# Patient Record
Sex: Female | Born: 1965 | Race: White | Hispanic: No | Marital: Single | Smoking: Current every day smoker
Health system: Southern US, Community
[De-identification: ages and names within clinical notes are randomized; demographics above are authoritative.]

## PROBLEM LIST (undated history)

## (undated) DIAGNOSIS — E119 Type 2 diabetes mellitus without complications: Secondary | ICD-10-CM

## (undated) DIAGNOSIS — E079 Disorder of thyroid, unspecified: Secondary | ICD-10-CM

## (undated) HISTORY — PX: OTHER SURGICAL HISTORY: SHX169

## (undated) HISTORY — PX: ECTOPIC PREGNANCY SURGERY: SHX613

## (undated) HISTORY — PX: APPENDECTOMY: SHX54

---

## 2004-10-02 ENCOUNTER — Emergency Department: Payer: Self-pay | Admitting: Emergency Medicine

## 2005-01-04 ENCOUNTER — Ambulatory Visit: Payer: Self-pay | Admitting: Family Medicine

## 2007-02-24 ENCOUNTER — Other Ambulatory Visit: Payer: Self-pay

## 2007-02-24 ENCOUNTER — Emergency Department: Payer: Self-pay | Admitting: Emergency Medicine

## 2007-03-07 ENCOUNTER — Emergency Department: Payer: Self-pay | Admitting: Emergency Medicine

## 2007-06-03 ENCOUNTER — Ambulatory Visit: Payer: Self-pay | Admitting: *Deleted

## 2007-08-15 ENCOUNTER — Emergency Department: Payer: Self-pay | Admitting: Emergency Medicine

## 2008-06-10 ENCOUNTER — Emergency Department: Payer: Self-pay | Admitting: Emergency Medicine

## 2008-10-02 IMAGING — CR DG CHEST 2V
1 series · 2 of 2 positions shown · non-contrast
Comparison: none

REASON FOR EXAM: sob cough, chills fever, poss pneumonia
COMMENTS:

PROCEDURE:     DXR - DXR CHEST PA (OR AP) AND LATERAL  - August 15, 2007  [DATE]
RESULT:     The lung fields are clear. The heart, mediastinal and osseous
structures show no significant abnormalities. Compared to the prior exam of
02/24/2007, no significant interval changes are seen.

[Series 1: view not recorded · 0.17mm/px · 2 of 2 slices shown]
[im 1/2]
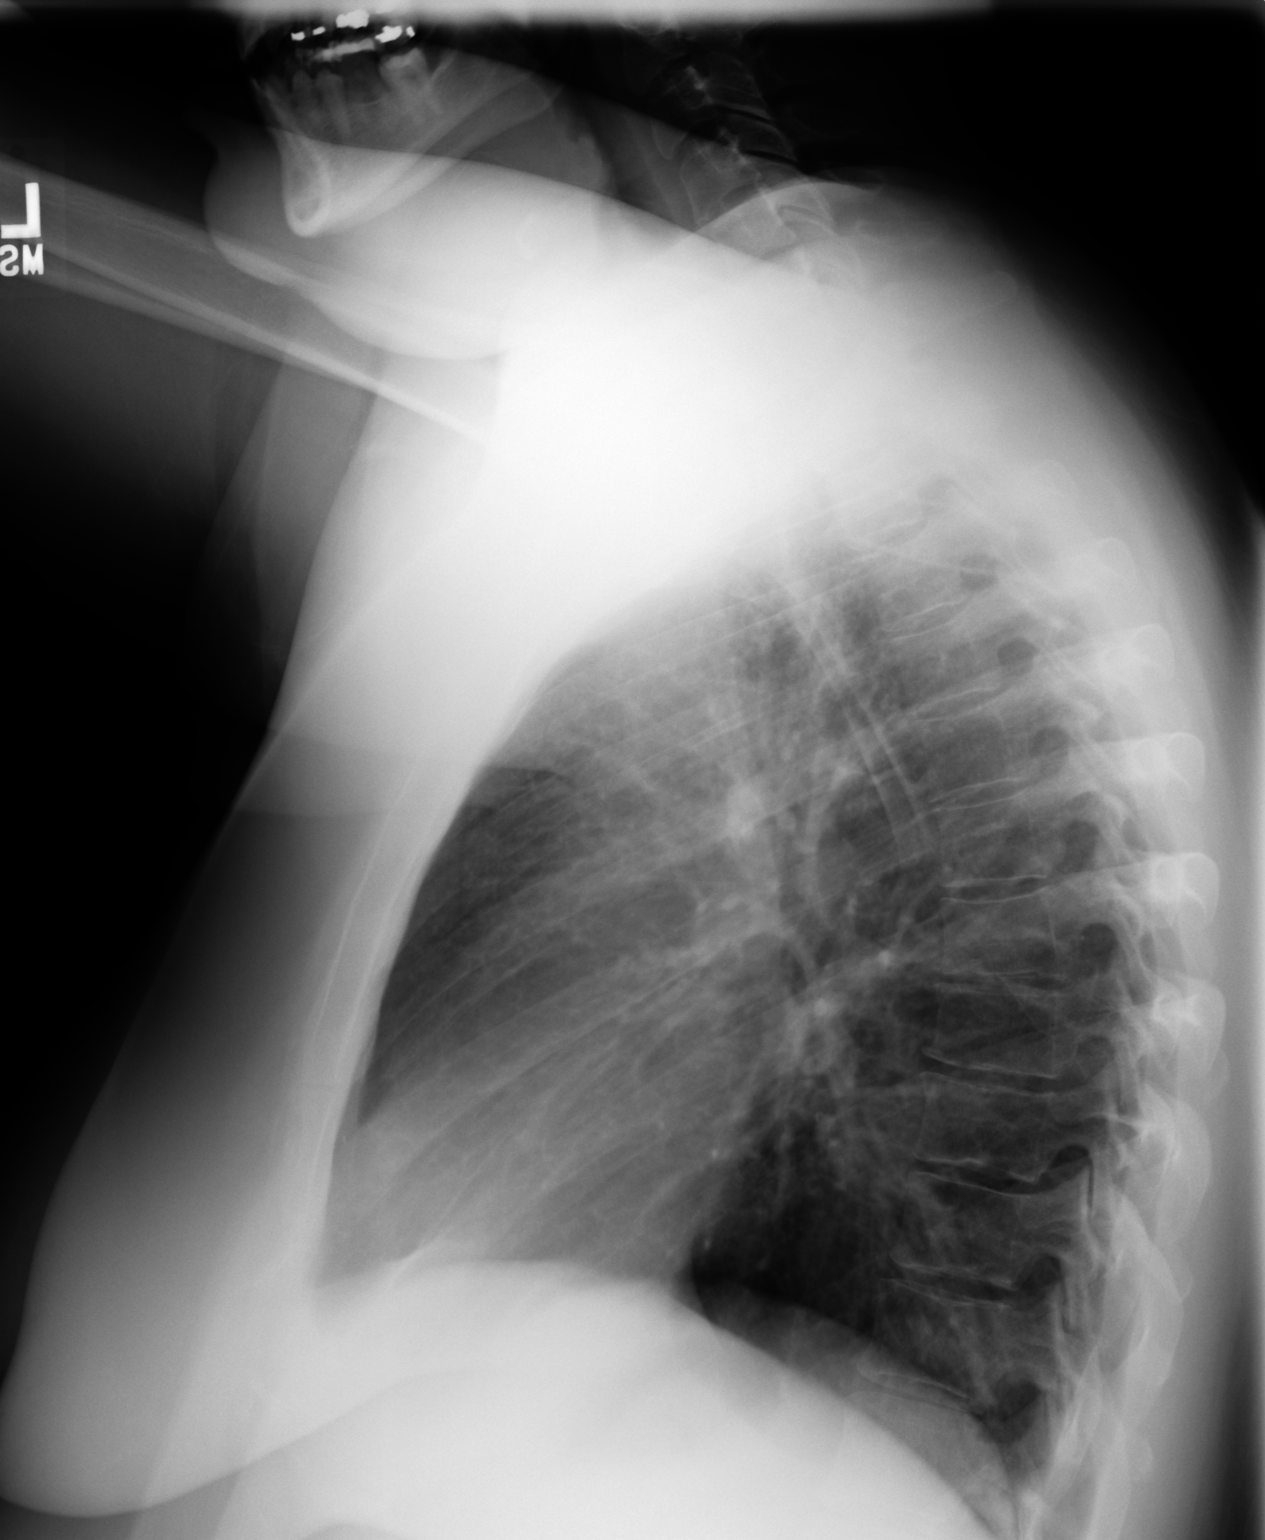
[im 2/2]
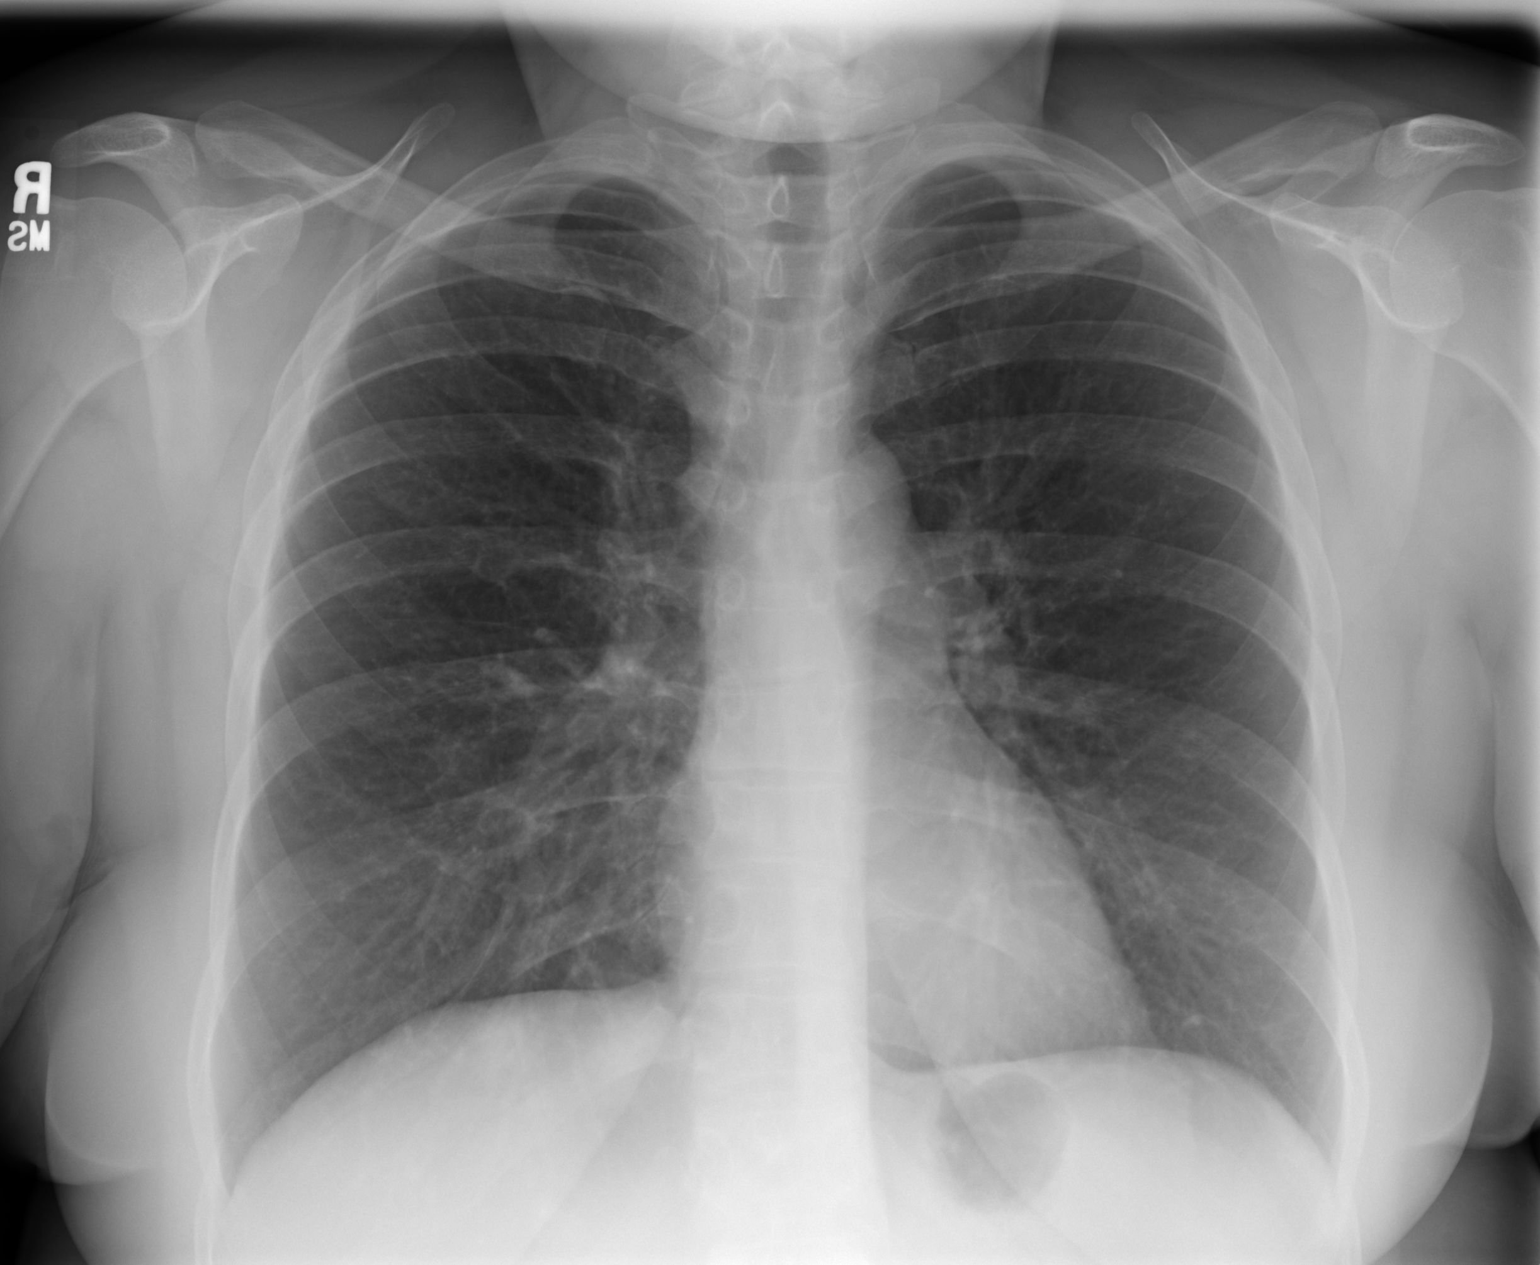

[2 of 2 positions shown; findings below may reference images not displayed]

IMPRESSION: 1.     No acute changes are identified.

## 2010-01-06 ENCOUNTER — Emergency Department: Payer: Self-pay | Admitting: Emergency Medicine

## 2016-05-18 DIAGNOSIS — F419 Anxiety disorder, unspecified: Secondary | ICD-10-CM | POA: Insufficient documentation

## 2016-05-18 DIAGNOSIS — E039 Hypothyroidism, unspecified: Secondary | ICD-10-CM | POA: Insufficient documentation

## 2016-07-16 ENCOUNTER — Ambulatory Visit (INDEPENDENT_AMBULATORY_CARE_PROVIDER_SITE_OTHER): Payer: Medicaid Other | Admitting: Podiatry

## 2016-07-16 ENCOUNTER — Ambulatory Visit: Payer: Self-pay

## 2016-07-16 DIAGNOSIS — M79675 Pain in left toe(s): Secondary | ICD-10-CM

## 2016-07-16 DIAGNOSIS — M2040 Other hammer toe(s) (acquired), unspecified foot: Secondary | ICD-10-CM | POA: Diagnosis not present

## 2016-07-16 NOTE — Progress Notes (Signed)
   Subjective:    Patient ID: Olivia MeekerSandra D Leonard, female    DOB: Sep 30, 1965, 50 y.o.   MRN: [id number]  HPI: She presents today to chief concern of second toenails bilaterally. 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 She has a history of previous ingrown toenails. And she states with these nails being where they are they're painful to wear shoes with.  Review of Systems  All other systems reviewed and are negative.      Objective:   Physical Exam: Vital signs are stable she is alert and oriented 3. Pulses are palpable. Neurologic sensorium is intact for some nausea monofilament. Deep tendon reflexes are intact. Muscle strength is 5 over 5 dorsal splint flexors and inverters everters on his musculature is intact. Orthopedic evaluation does demonstrate mild flexible hammertoe deformities early distal clavus second digit left foot possibly resulting in some of the numbness that she has. Her toenails do demonstrate a onychocryptosis particularly the hallux and second digits laterally. The remainder of the nail plates appear to be normal. There does not appear to be any fungus.        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  Plan: Debridement of these nails for her today. I also recommended that she follow-up with pedicurist.

## 2019-11-24 ENCOUNTER — Other Ambulatory Visit: Payer: Self-pay

## 2019-11-25 ENCOUNTER — Ambulatory Visit
Admission: RE | Admit: 2019-11-25 | Discharge: 2019-11-25 | Disposition: A | Discharge status: Home / Self Care | Payer: Medicaid Other | Source: Ambulatory Visit | Attending: Oncology | Admitting: Oncology

## 2019-11-25 ENCOUNTER — Ambulatory Visit: Payer: Medicaid Other | Attending: Oncology

## 2019-11-25 VITALS — BP 135/85 | HR 73 | Temp 95.9°F | Ht 66.5 in | Wt 285.4 lb

## 2019-11-25 DIAGNOSIS — Z0000 Encounter for general adult medical examination without abnormal findings: Secondary | ICD-10-CM

## 2019-11-25 NOTE — Progress Notes (Signed)
  Subjective:     Patient ID: Olivia Leonard, female   DOB: 1965/12/08, 54 y.o.   MRN: [id number]  HPI   Review of Systems     Objective:   Physical Exam Chest:     Breasts:        Right: No swelling, bleeding, inverted nipple, mass, nipple discharge, skin change or tenderness.        Left: No swelling, bleeding, inverted nipple, mass, nipple discharge, skin change or tenderness.        Assessment:     54 year old patient presents for BCCCP clinic visitPatient screened, and meets BCCCP eligibility.  Patient does not have insurance, Medicare or Medicaid. Instructed patient on breast self awareness using teach back methodClinical breast exam unremarkable. Risk Assessment    Risk Scores      11/25/2019   Last edited by: Alta Corning, CMA   5-year risk: 0.9 %   Lifetime risk: 7.8 %            Plan:     Sent for bilateral screening mammogram.

## 2019-12-01 ENCOUNTER — Other Ambulatory Visit: Payer: Self-pay | Admitting: *Deleted

## 2019-12-01 ENCOUNTER — Inpatient Hospital Stay
Admission: RE | Admit: 2019-12-01 | Discharge: 2019-12-01 | Disposition: A | Discharge status: Home / Self Care | Payer: Self-pay | Source: Ambulatory Visit | Attending: *Deleted | Admitting: *Deleted

## 2019-12-01 DIAGNOSIS — Z1231 Encounter for screening mammogram for malignant neoplasm of breast: Secondary | ICD-10-CM

## 2019-12-04 NOTE — Progress Notes (Signed)
Letter mailed from [facility] to notify of normal mammogram results.  Patient to return in one year for annual screening.  Copy to [facility]. 

## 2020-02-01 ENCOUNTER — Ambulatory Visit: Payer: Self-pay | Admitting: Podiatry

## 2020-04-05 ENCOUNTER — Other Ambulatory Visit: Payer: Self-pay

## 2020-04-05 ENCOUNTER — Emergency Department: Payer: Medicaid Other

## 2020-04-05 ENCOUNTER — Inpatient Hospital Stay
Admission: EM | Admit: 2020-04-05 | Discharge: 2020-04-09 | DRG: 177 | Disposition: A | Discharge status: Home / Self Care | Payer: Medicaid Other | Attending: Internal Medicine | Admitting: Internal Medicine

## 2020-04-05 ENCOUNTER — Inpatient Hospital Stay: Payer: Medicaid Other

## 2020-04-05 DIAGNOSIS — Z720 Tobacco use: Secondary | ICD-10-CM

## 2020-04-05 DIAGNOSIS — Z6841 Body Mass Index (BMI) 40.0 and over, adult: Secondary | ICD-10-CM | POA: Diagnosis not present

## 2020-04-05 DIAGNOSIS — A0839 Other viral enteritis: Secondary | ICD-10-CM | POA: Diagnosis present

## 2020-04-05 DIAGNOSIS — R042 Hemoptysis: Secondary | ICD-10-CM

## 2020-04-05 DIAGNOSIS — J45909 Unspecified asthma, uncomplicated: Secondary | ICD-10-CM | POA: Diagnosis present

## 2020-04-05 DIAGNOSIS — Z79890 Hormone replacement therapy (postmenopausal): Secondary | ICD-10-CM

## 2020-04-05 DIAGNOSIS — J309 Allergic rhinitis, unspecified: Secondary | ICD-10-CM | POA: Diagnosis not present

## 2020-04-05 DIAGNOSIS — R0902 Hypoxemia: Secondary | ICD-10-CM

## 2020-04-05 DIAGNOSIS — J4520 Mild intermittent asthma, uncomplicated: Secondary | ICD-10-CM | POA: Diagnosis not present

## 2020-04-05 DIAGNOSIS — J1282 Pneumonia due to coronavirus disease 2019: Secondary | ICD-10-CM | POA: Diagnosis present

## 2020-04-05 DIAGNOSIS — U071 COVID-19: Principal | ICD-10-CM | POA: Diagnosis present

## 2020-04-05 DIAGNOSIS — F17210 Nicotine dependence, cigarettes, uncomplicated: Secondary | ICD-10-CM | POA: Diagnosis present

## 2020-04-05 DIAGNOSIS — E039 Hypothyroidism, unspecified: Secondary | ICD-10-CM | POA: Diagnosis present

## 2020-04-05 DIAGNOSIS — K219 Gastro-esophageal reflux disease without esophagitis: Secondary | ICD-10-CM | POA: Diagnosis present

## 2020-04-05 DIAGNOSIS — Z881 Allergy status to other antibiotic agents status: Secondary | ICD-10-CM | POA: Diagnosis not present

## 2020-04-05 DIAGNOSIS — J9601 Acute respiratory failure with hypoxia: Secondary | ICD-10-CM | POA: Diagnosis not present

## 2020-04-05 HISTORY — DX: Disorder of thyroid, unspecified: E07.9

## 2020-04-05 LAB — HEPATIC FUNCTION PANEL
ALT: 22 U/L (ref 0–44)
AST: 26 U/L (ref 15–41)
Albumin: 3.5 g/dL (ref 3.5–5.0)
Alkaline Phosphatase: 85 U/L (ref 38–126)
Bilirubin, Direct: 0.1 mg/dL (ref 0.0–0.2)
Indirect Bilirubin: 0.8 mg/dL (ref 0.3–0.9)
Total Bilirubin: 0.9 mg/dL (ref 0.3–1.2)
Total Protein: 7.3 g/dL (ref 6.5–8.1)

## 2020-04-05 LAB — URINALYSIS, COMPLETE (UACMP) WITH MICROSCOPIC
Bacteria, UA: NONE SEEN
Bilirubin Urine: NEGATIVE
Glucose, UA: NEGATIVE mg/dL
Ketones, ur: NEGATIVE mg/dL
Leukocytes,Ua: NEGATIVE
Nitrite: NEGATIVE
Protein, ur: >=300 mg/dL — AB
Specific Gravity, Urine: 1.017 (ref 1.005–1.030)
pH: 6 (ref 5.0–8.0)

## 2020-04-05 LAB — BASIC METABOLIC PANEL
Anion gap: 11 (ref 5–15)
BUN: 9 mg/dL (ref 6–20)
CO2: 28 mmol/L (ref 22–32)
Calcium: 8.7 mg/dL — ABNORMAL LOW (ref 8.9–10.3)
Chloride: 98 mmol/L (ref 98–111)
Creatinine, Ser: 0.5 mg/dL (ref 0.44–1.00)
GFR calc Af Amer: >60 mL/min (ref >60)
GFR calc non Af Amer: >60 mL/min (ref >60)
Glucose, Bld: 113 mg/dL — ABNORMAL HIGH (ref 70–99)
Potassium: 4.2 mmol/L (ref 3.5–5.1)
Sodium: 137 mmol/L (ref 135–145)

## 2020-04-05 LAB — SARS CORONAVIRUS 2 BY RT PCR (HOSPITAL ORDER, PERFORMED IN ~~LOC~~ HOSPITAL LAB): SARS Coronavirus 2: POSITIVE — AB

## 2020-04-05 LAB — BRAIN NATRIURETIC PEPTIDE: B Natriuretic Peptide: 32.8 pg/mL (ref 0.0–100.0)

## 2020-04-05 LAB — CBC
HCT: 47.7 % — ABNORMAL HIGH (ref 36.0–46.0)
Hemoglobin: 14.1 g/dL (ref 12.0–15.0)
MCH: 24.1 pg — ABNORMAL LOW (ref 26.0–34.0)
MCHC: 29.6 g/dL — ABNORMAL LOW (ref 30.0–36.0)
MCV: 81.7 fL (ref 80.0–100.0)
Platelets: 160 10*3/uL (ref 150–400)
RBC: 5.84 MIL/uL — ABNORMAL HIGH (ref 3.87–5.11)
RDW: 17.4 % — ABNORMAL HIGH (ref 11.5–15.5)
WBC: 6 10*3/uL (ref 4.0–10.5)
nRBC: 0 % (ref 0.0–0.2)

## 2020-04-05 LAB — TROPONIN I (HIGH SENSITIVITY)
Troponin I (High Sensitivity): 7 ng/L (ref <18)
Troponin I (High Sensitivity): 5 ng/L (ref <18)

## 2020-04-05 LAB — MAGNESIUM: Magnesium: 2 mg/dL (ref 1.7–2.4)

## 2020-04-05 MED ORDER — PANTOPRAZOLE SODIUM 40 MG PO TBEC
40.0000 mg | DELAYED_RELEASE_TABLET | Freq: Every day | ORAL | Status: DC
  Administered 2020-04-06 – 2020-04-09 (×4): 40 mg via ORAL
  Filled 2020-04-05 (×4): qty 1

## 2020-04-05 MED ORDER — ENOXAPARIN SODIUM 40 MG/0.4ML ~~LOC~~ SOLN
40.0000 mg | Freq: Two times a day (BID) | SUBCUTANEOUS | Status: DC
  Administered 2020-04-06 (×2): 40 mg via SUBCUTANEOUS
  Filled 2020-04-05 (×5): qty 0.4

## 2020-04-05 MED ORDER — GUAIFENESIN-DM 100-10 MG/5ML PO SYRP: 10.0000 mL | ORAL_SOLUTION | ORAL | Status: DC | PRN

## 2020-04-05 MED ORDER — SODIUM CHLORIDE 0.9 % IV SOLN
200.0000 mg | Freq: Once | INTRAVENOUS | Status: AC
  Administered 2020-04-05: 200 mg via INTRAVENOUS
  Filled 2020-04-05: qty 200

## 2020-04-05 MED ORDER — SODIUM CHLORIDE 0.9 % IV SOLN
100.0000 mg | Freq: Every day | INTRAVENOUS | Status: AC
  Administered 2020-04-06 – 2020-04-09 (×4): 100 mg via INTRAVENOUS
  Filled 2020-04-05 (×3): qty 20

## 2020-04-05 MED ORDER — ALBUTEROL SULFATE HFA 108 (90 BASE) MCG/ACT IN AERS
2.0000 | INHALATION_SPRAY | Freq: Four times a day (QID) | RESPIRATORY_TRACT | Status: DC
  Administered 2020-04-06 – 2020-04-09 (×14): 2 via RESPIRATORY_TRACT
  Filled 2020-04-05: qty 6.7

## 2020-04-05 MED ORDER — IOHEXOL 350 MG/ML SOLN
100.0000 mL | Freq: Once | INTRAVENOUS | Status: AC | PRN
  Administered 2020-04-05: 100 mL via INTRAVENOUS
  Filled 2020-04-05: qty 100

## 2020-04-05 MED ORDER — LORATADINE 10 MG PO TABS
10.0000 mg | ORAL_TABLET | Freq: Every day | ORAL | Status: DC
  Administered 2020-04-06 – 2020-04-09 (×4): 10 mg via ORAL
  Filled 2020-04-05 (×4): qty 1

## 2020-04-05 MED ORDER — SODIUM CHLORIDE 0.9 % IV SOLN: 200.0000 mg | Freq: Once | INTRAVENOUS | Status: DC

## 2020-04-05 MED ORDER — HYDROCOD POLST-CPM POLST ER 10-8 MG/5ML PO SUER: 5.0000 mL | Freq: Two times a day (BID) | ORAL | Status: DC | PRN

## 2020-04-05 MED ORDER — DEXAMETHASONE SODIUM PHOSPHATE 10 MG/ML IJ SOLN
6.0000 mg | INTRAMUSCULAR | Status: DC
  Administered 2020-04-06: 6 mg via INTRAVENOUS
  Filled 2020-04-05 (×2): qty 1

## 2020-04-05 MED ORDER — ACETAMINOPHEN 500 MG PO TABS
1000.0000 mg | ORAL_TABLET | Freq: Once | ORAL | Status: AC
  Administered 2020-04-05: 1000 mg via ORAL
  Filled 2020-04-05: qty 2

## 2020-04-05 MED ORDER — LEVOTHYROXINE SODIUM 50 MCG PO TABS
175.0000 ug | ORAL_TABLET | Freq: Every day | ORAL | Status: DC
  Administered 2020-04-06 – 2020-04-09 (×4): 175 ug via ORAL
  Filled 2020-04-05 (×4): qty 1

## 2020-04-05 MED ORDER — SODIUM CHLORIDE 0.9 % IV SOLN: 100.0000 mg | Freq: Every day | INTRAVENOUS | Status: DC

## 2020-04-05 NOTE — ED Notes (Signed)
Pt states coming in due to coughing blood. Pt states being diagnosed with covid-19 5 days ago. Pt is on 2lpm via [place]. Pt states this is new.

## 2020-04-05 NOTE — Progress Notes (Signed)
Remdesivir - Pharmacy Brief Note   O:  ALT: 22  CXR:  SpO2:  95 % on RA   Was diagnosed with COVID on 8/5   A/P:  Remdesivir 200 mg IVPB once followed by 100 mg IVPB daily x 4 days.   Brace Welte D 04/05/2020 8:42 PM

## 2020-04-05 NOTE — ED Provider Notes (Signed)
[facility] Emergency Department Provider Note  ____________________________________________   First MD Initiated Contact with Patient 04/05/20 1900     (approximate)  I have reviewed the triage vital signs and the nursing notes.   HISTORY  Chief Complaint Hemoptysis   HPI Olivia Leonard is a 54 y.o. female with a past medical history of hypothyroidism, GERD, tobacco abuse, and asthma who presents for assessment of persistent shortness of breath associated with myalgias, fevers, diarrhea, and blood-streaked sputum over the last day.  Patient states he has been feeling bad for approximately 5 days and was tested for Covid and found to be positive on 8/5.  She states that all of her symptoms have progressed since then.  She states she had some red Jell-O on 8/7 and some red Gatorade on 8/6 but did not have any red foods yesterday the day before when she had some blood in her sputum.  She endorses mild headache denies any chest pain, dull pain, vomiting, rash, dysuria, blood in her stool, blood in her urine, being on blood thinners, or acute complaints.  No focal extremity weakness numbness or tingling.  No recent traumatic injuries.  No history of blood clots.  Patient is not currently coagulated.  Denies EtOH or illicit drug use.         Past Medical History:  Diagnosis Date  . Thyroid disease     Patient Active Problem List   Diagnosis Date Noted  . COVID-19 04/05/2020  . Hypoxia 04/05/2020  . Hemoptysis 04/05/2020  . Asthma 04/05/2020  . Allergic rhinitis 04/05/2020  . Tobacco use 04/05/2020  . Acquired hypothyroidism 05/18/2016  . Anxiety 05/18/2016    Past Surgical History:  Procedure Laterality Date  . APPENDECTOMY    . c-section    . ECTOPIC PREGNANCY SURGERY      Prior to Admission medications   Medication Sig Start Date End Date Taking? Authorizing Provider  cetirizine (ZYRTEC) 10 MG tablet Take 10 mg by mouth daily.    [provider]  levothyroxine (SYNTHROID, LEVOTHROID) 175 MCG tablet Take 175 mcg by mouth daily before breakfast.    [provider]  OMEPRAZOLE PO Take by mouth.    [provider]    Allergies Biaxin [clarithromycin]  No family history on file.  Social History Social History   Tobacco Use  . Smoking status: Current Every Day Smoker    Types: Cigarettes  . Smokeless tobacco: Never Used  Substance Use Topics  . Alcohol use: Not Currently  . Drug use: Not Currently    Review of Systems  Review of Systems  Constitutional: Positive for chills, fever and malaise/fatigue.  HENT: Negative for sore throat.   Eyes: Negative for pain.  Respiratory: Positive for cough and shortness of breath. Negative for stridor.   Cardiovascular: Negative for chest pain.  Gastrointestinal: Positive for diarrhea and nausea. Negative for vomiting.  Genitourinary: Negative for dysuria and frequency.  Musculoskeletal: Positive for myalgias.  Skin: Negative for rash.  Neurological: Negative for seizures, loss of consciousness and headaches.  Psychiatric/Behavioral: Negative for suicidal ideas.  All other systems reviewed and are negative.     ____________________________________________   PHYSICAL EXAM:  VITAL SIGNS: ED Triage Vitals  Enc Vitals Group     BP 04/05/20 1104 (!) 152/84     Pulse Rate 04/05/20 1104 (!) 107     Resp 04/05/20 1104 (!) 22     Temp 04/05/20 1104 99.3 F (37.4 C)  Temp Source 04/05/20 1104 Oral     SpO2 04/05/20 1104 (!) 88 %     Weight 04/05/20 1106 284 lb (128.8 kg)     Height 04/05/20 1106 5\' 6"  (1.676 m)     Head Circumference --      Peak Flow --      Pain Score 04/05/20 1106 3     Pain Loc --      Pain Edu? --      Excl. in GC? --    Vitals:   04/05/20 1448 04/05/20 2015  BP: (!) 152/72 140/82  Pulse: 87 86  Resp: (!) 24 (!) 24  Temp: 98.6 F (37 C)   SpO2: 94% 95%   Physical Exam Vitals and nursing note reviewed.   Constitutional:      General: She is not in acute distress.    Appearance: She is well-developed.  HENT:     Head: Normocephalic and atraumatic.     Right Ear: External ear normal.     Left Ear: External ear normal.     Nose: Nose normal.     Mouth/Throat:     Mouth: Mucous membranes are moist.  Eyes:     Conjunctiva/sclera: Conjunctivae normal.  Cardiovascular:     Rate and Rhythm: Normal rate and regular rhythm.     Heart sounds: No murmur heard.   Pulmonary:     Effort: Pulmonary effort is normal. Tachypnea present. No respiratory distress.     Breath sounds: Normal breath sounds.  Abdominal:     Palpations: Abdomen is soft.     Tenderness: There is no abdominal tenderness.  Musculoskeletal:     Cervical back: Neck supple.     Right lower leg: No edema.     Left lower leg: No edema.  Skin:    General: Skin is warm and dry.     Capillary Refill: Capillary refill takes less than 2 seconds.  Neurological:     Mental Status: She is alert and oriented to person, place, and time.  Psychiatric:        Mood and Affect: Mood normal.      ____________________________________________   LABS (all labs ordered are listed, but only abnormal results are displayed)  Labs Reviewed  BASIC METABOLIC PANEL - Abnormal; Notable for the following components:      Result Value   Glucose, Bld 113 (*)    Calcium 8.7 (*)    All other components within normal limits  CBC - Abnormal; Notable for the following components:   RBC 5.84 (*)    HCT 47.7 (*)    MCH 24.1 (*)    MCHC 29.6 (*)    RDW 17.4 (*)    All other components within normal limits  URINALYSIS, COMPLETE (UACMP) WITH MICROSCOPIC - Abnormal; Notable for the following components:   Color, Urine YELLOW (*)    APPearance HAZY (*)    Hgb urine dipstick SMALL (*)    Protein, ur >=300 (*)    All other components within normal limits  SARS CORONAVIRUS 2 BY RT PCR (HOSPITAL ORDER, PERFORMED IN [facility])  HEPATIC  FUNCTION PANEL  BRAIN NATRIURETIC PEPTIDE  MAGNESIUM  TROPONIN I (HIGH SENSITIVITY)   ____________________________________________  EKG  Sinus rhythm with a ventricular rate of 83, normal axis, unremarkable intervals, no evidence of acute ischemia or other underlying arrhythmia. ____________________________________________  RADIOLOGY  ED MD interpretation: Bilateral patchy infiltrates without evidence of significant edema, pneumothorax, or effusion.  Official  radiology report(s): DG Chest 2 View  Result Date: 04/05/2020 CLINICAL DATA:  Shortness of breath, hypoxia, COVID-19 positive EXAM: CHEST - 2 VIEW COMPARISON:  08/15/2007 FINDINGS: The heart size and mediastinal contours are within normal limits. Streaky bilateral interstitial opacities within the perihilar and bibasilar regions. No pleural effusion. No pneumothorax. The visualized skeletal structures are unremarkable. IMPRESSION: Streaky bilateral perihilar and bibasilar interstitial opacities suspicious for atypical/viral pneumonia. Electronically Signed   By: Duanne Guess D.O.   On: 04/05/2020 11:34    ____________________________________________   PROCEDURES  Procedure(s) performed (including Critical Care):  .1-3 Lead EKG Interpretation Performed by: Gilles Chiquito, MD Authorized by: Gilles Chiquito, MD     Interpretation: normal     ECG rate assessment: normal     Rhythm: sinus rhythm     Ectopy: none     Conduction: normal   .Critical Care Performed by: Gilles Chiquito, MD Authorized by: Gilles Chiquito, MD   Critical care provider statement:    Critical care time (minutes):  45   Critical care was necessary to treat or prevent imminent or life-threatening deterioration of the following conditions:  Respiratory failure   Critical care was time spent personally by me on the following activities:  Discussions with consultants, evaluation of patient's response to treatment, examination of patient,  ordering and performing treatments and interventions, ordering and review of laboratory studies, ordering and review of radiographic studies, pulse oximetry, re-evaluation of patient's condition, obtaining history from patient or surrogate and review of old charts     ____________________________________________   INITIAL IMPRESSION / ASSESSMENT AND PLAN / ED COURSE        Patient presents with above-stated history exam for assessment of hemoptysis in the setting of recent positive COVID-19 test and other above symptoms.  Patient is noted to be tachypneic and with SPO2 saturation of 95% on 2 L nasal cannula.  I did have the patient stand and ambulate in place with no oxygen supplementation and she was noted to have an SPO2 saturation of 87% before trial was stopped and patient was placed back on O2 cannula with improvement in her O2 to sats back to 95.  Chest x-ray does show findings concerning for likely Covid pneumonia given patient did have a positive test.  However since I am unable to view this test we will send a Covid PCR while she is in the ED.  In addition while it is certainly possible her blood-streaked sputum is from her COVID-19 causing some sloughing from her pulmonary epithelium will also plan to obtain a CTA chest to assess for evidence of PE.  Low suspicion for ACS given patient denies any chest pain and EKG is reassuring although we will plan to obtain troponins.  Patient given below noted therapies and admitted to hospital service and fair condition for further evaluation and management.  Medications  acetaminophen (TYLENOL) tablet 1,000 mg (1,000 mg Oral Given 04/05/20 1952)   _______________________________________   FINAL CLINICAL IMPRESSION(S) / ED DIAGNOSES  Final diagnoses:  Acute respiratory failure with hypoxia (HCC)  COVID-19     ED Discharge Orders    None       Note:  This document was prepared using Dragon voice recognition software and may include  unintentional dictation errors.   Gilles Chiquito, MD 04/05/20 2023

## 2020-04-05 NOTE — ED Triage Notes (Signed)
Pt states she was dx with covid last Thursday and since sunday she has had blood in her sputum when she coughs. Denies any increased SOB, is in NAD 88%RA in triage

## 2020-04-05 NOTE — H&P (Signed)
History and Physical        Hospital Admission Note Date: 04/05/2020  Patient name: Olivia Leonard Medical record number: [id number] Date of birth: [birth year] Age: 54 y.o. Gender: female  PCP: Abram Sander, MD    Patient coming from: Home   I have reviewed all records in the [facility].    Chief Complaint:  Hemoptysis   HPI: Olivia Leonard is 54 y.o. female with PMH of hypothyroidism, asthma, allergic rhinitis, GERD, tobacco use who presents for SOB and blood streaked sputum over the past day. Has been feeling poorly for five days with myalgias, fevers, diarrhea, and cough. Tested positive for COVID on 8/5 with rapid test. Boyfriend + as well. Not vaccinated.  She states she had some red Jell-O on 8/7 and some red Gatorade on 8/6 but did not have any red foods yesterday the day before when she had some blood in her sputum. Endorses some chest tightness but no frank pain. Denies blood in stool or urine. Not anticoagulated. No history of prior VTE.    ED work-up/course:  Patient presents with above-stated history exam for assessment of hemoptysis in the setting of recent positive COVID-19 test and other above symptoms.  Patient is noted to be tachypneic and with SPO2 saturation of 95% on 2 L nasal cannula.  I did have the patient stand and ambulate in place with no oxygen supplementation and she was noted to have an SPO2 saturation of 87% before trial was stopped and patient was placed back on O2 cannula with improvement in her O2 to sats back to 95.  Chest x-ray does show findings concerning for likely Covid pneumonia given patient did have a positive test.  However since I am unable to view this test we will send a Covid PCR while she is in the ED.  In addition while it is certainly possible her blood-streaked sputum is from her COVID-19 causing some sloughing from her pulmonary epithelium will also plan to obtain a CTA  chest to assess for evidence of PE.  Low suspicion for ACS given patient denies any chest pain and EKG is reassuring although we will plan to obtain troponins.  Patient given below noted therapies and admitted to hospital service and fair condition for further evaluation and management.    Review of Systems: Positives marked in 'bold' Constitutional: Denies fever, chills, diaphoresis, poor appetite and fatigue.  HEENT: Denies photophobia, eye pain, redness, hearing loss, ear pain, congestion, sore throat, rhinorrhea, sneezing, mouth sores, trouble swallowing, neck pain, neck stiffness and tinnitus.   Respiratory: Denies SOB, DOE, cough, chest tightness,  and wheezing.   Cardiovascular: Denies chest pain, palpitations and leg swelling.  Gastrointestinal: Denies nausea, vomiting, abdominal pain, diarrhea, constipation, blood in stool and abdominal distention.  Genitourinary: Denies dysuria, urgency, frequency, hematuria, flank pain and difficulty urinating.  Musculoskeletal: Denies myalgias, back pain, joint swelling, arthralgias and gait problem.  Skin: Denies pallor, rash and wound.  Neurological: Denies dizziness, seizures, syncope, weakness, light-headedness, numbness and headaches.  Hematological: Denies adenopathy. Easy bruising, personal or family bleeding history  Psychiatric/Behavioral: Denies suicidal ideation, mood changes, confusion, nervousness, sleep disturbance and agitation  Past Medical  History: Past Medical History:  Diagnosis Date  . Thyroid disease     Past Surgical History:  Procedure Laterality Date  . APPENDECTOMY    . c-section    . ECTOPIC PREGNANCY SURGERY      Medications: Prior to Admission medications   Medication Sig Start Date End Date Taking? Authorizing Provider  cetirizine (ZYRTEC) 10 MG tablet Take 10 mg by mouth daily.    [provider]  levothyroxine (SYNTHROID, LEVOTHROID) 175 MCG tablet Take 175 mcg by mouth daily before breakfast.     [provider]  OMEPRAZOLE PO Take by mouth.    [provider]    Allergies:   Allergies  Allergen Reactions  . Biaxin [Clarithromycin]     Social History:  reports that she has been smoking cigarettes. She has never used smokeless tobacco. She reports previous alcohol use. She reports previous drug use.  Family History: No family history on file.  Physical Exam: Blood pressure 140/82, pulse 86, temperature 98.6 F (37 C), temperature source Oral, resp. rate (!) 24, height 5\' 6"  (1.676 m), weight 128.8 kg, SpO2 95 %. General: Alert, awake, oriented x3, in no acute distress. Eyes: pink conjunctiva,anicteric sclera, pupils equal and reactive to light and accomodation, HEENT: normocephalic, atraumatic, oropharynx clear Neck: supple, no masses or lymphadenopathy, no goiter, no bruits, no JVD CVS: Regular rate and rhythm, without murmurs, rubs or gallops. No lower extremity edema Resp : Clear to auscultation bilaterally, no wheezing, rales or rhonchi. Tachypnea present. [place] in place.  GI : Soft, nontender, nondistended, positive bowel sounds, no masses. No hepatomegaly. No hernia.  Musculoskeletal: No clubbing or cyanosis, positive pedal pulses. No contracture. ROM intact  Neuro: Grossly intact, no focal neurological deficits, strength 5/5 upper and lower extremities bilaterally Psych: alert and oriented x 3, normal mood and affect Skin: no rashes or lesions, warm and dry   LABS on Admission: I have personally reviewed all the labs and imagings below    Basic Metabolic Panel: Recent Labs  Lab 04/05/20 1158  NA 137  K 4.2  CL 98  CO2 28  GLUCOSE 113*  BUN 9  CREATININE 0.50  CALCIUM 8.7*   Liver Function Tests: Recent Labs  Lab 04/05/20 1158  AST 26  ALT 22  ALKPHOS 85  BILITOT 0.9  PROT 7.3  ALBUMIN 3.5   No results for input(s): LIPASE, AMYLASE in the last 168 hours. No results for input(s): AMMONIA in the last 168 hours. CBC: Recent Labs   Lab 04/05/20 1158  WBC 6.0  HGB 14.1  HCT 47.7*  MCV 81.7  PLT 160   Cardiac Enzymes: No results for input(s): CKTOTAL, CKMB, CKMBINDEX, TROPONINI in the last 168 hours. BNP: Invalid input(s): POCBNP CBG: No results for input(s): GLUCAP in the last 168 hours.  Radiological Exams on Admission:  DG Chest 2 View  Result Date: 04/05/2020 CLINICAL DATA:  Shortness of breath, hypoxia, COVID-19 positive EXAM: CHEST - 2 VIEW COMPARISON:  08/15/2007 FINDINGS: The heart size and mediastinal contours are within normal limits. Streaky bilateral interstitial opacities within the perihilar and bibasilar regions. No pleural effusion. No pneumothorax. The visualized skeletal structures are unremarkable. IMPRESSION: Streaky bilateral perihilar and bibasilar interstitial opacities suspicious for atypical/viral pneumonia. Electronically Signed   By: 08/17/2007 D.O.   On: 04/05/2020 11:34      EKG: Independently reviewed. NSR.   Assessment/Plan Active Problems:   COVID-19   Hypoxia   Hemoptysis   Asthma   Allergic rhinitis  Tobacco use   Acute Hypoxia with Hemoptysis and Report of COVID+ test  Presumed COVID+ due to symptoms and report of outside positive test. Patient is hypoxic to upper 80s with ambulation. Stable on 2L . CXR shows streaky bilateral perihilar and bibasilar interstitial opacities suspicious for atypical/viral PNA.  -admit to MedSurg, telemetry with continuous pulse ox  -COVID testing pending  -obtain COVID labs today and daily  -CTA chest to evaluate for PE; will need full dose anticoagulation if positive  -start Decadron and Remdesivir  -antussives and albuterol ordered  -supplemental O2, wean as appropriate   Asthma/Allergic Rhinitis  -albuterol as above  -continue antihistamine   Tobacco Use  Has not smoked for 2 days.  -offer nicotine patch if needed during admission   DVT prophylaxis: Lovenox   CODE STATUS: FULL   Consults called: None    Family Communication: Admission, patients condition and plan of care including tests being ordered have been discussed with the patient who indicates understanding and agree with the plan and Code Status  Admission status: Inpatient   The medical decision making on this patient was of high complexity and the patient is at high risk for clinical deterioration, therefore this is a level 3 admission.  Severity of Illness:     The appropriate patient status for this patient is INPATIENT. Inpatient status is judged to be reasonable and necessary in order to provide the required intensity of service to ensure the patient's safety. The patient's presenting symptoms, physical exam findings, and initial radiographic and laboratory data in the context of their chronic comorbidities is felt to place them at high risk for further clinical deterioration. Furthermore, it is not anticipated that the patient will be medically stable for discharge from the hospital within 2 midnights of admission. The following factors support the patient status of inpatient.   " The patient's presenting symptoms include SOB, cough, hemoptysis. " The worrisome physical exam findings include tachypnea, hypoxia. " The initial radiographic and laboratory data are worrisome because of CXR with infiltrates. " The chronic co-morbidities include asthma, allergic rhinitis, tobacco use.   * I certify that at the point of admission it is my clinical judgment that the patient will require inpatient hospital care spanning beyond 2 midnights from the point of admission due to high intensity of service, high risk for further deterioration and high frequency of surveillance required.*    Time Spent on Admission: 45 minutes      De Hollingshead D.O.  [facility] 04/05/2020, 8:25 PM

## 2020-04-06 LAB — CBC WITH DIFFERENTIAL/PLATELET
Abs Immature Granulocytes: 0.02 10*3/uL (ref 0.00–0.07)
Basophils Absolute: 0 10*3/uL (ref 0.0–0.1)
Basophils Relative: 1 %
Eosinophils Absolute: 0 10*3/uL (ref 0.0–0.5)
Eosinophils Relative: 0 %
HCT: 48.4 % — ABNORMAL HIGH (ref 36.0–46.0)
Hemoglobin: 14.6 g/dL (ref 12.0–15.0)
Immature Granulocytes: 1 %
Lymphocytes Relative: 15 %
Lymphs Abs: 0.6 10*3/uL — ABNORMAL LOW (ref 0.7–4.0)
MCH: 24.6 pg — ABNORMAL LOW (ref 26.0–34.0)
MCHC: 30.2 g/dL (ref 30.0–36.0)
MCV: 81.5 fL (ref 80.0–100.0)
Monocytes Absolute: 0.1 10*3/uL (ref 0.1–1.0)
Monocytes Relative: 2 %
Neutro Abs: 3.5 10*3/uL (ref 1.7–7.7)
Neutrophils Relative %: 81 %
Platelets: 154 10*3/uL (ref 150–400)
RBC: 5.94 MIL/uL — ABNORMAL HIGH (ref 3.87–5.11)
RDW: 18 % — ABNORMAL HIGH (ref 11.5–15.5)
WBC: 4.3 10*3/uL (ref 4.0–10.5)
nRBC: 0 % (ref 0.0–0.2)

## 2020-04-06 LAB — FERRITIN: Ferritin: 30 ng/mL (ref 11–307)

## 2020-04-06 LAB — COMPREHENSIVE METABOLIC PANEL
ALT: 25 U/L (ref 0–44)
AST: 36 U/L (ref 15–41)
Albumin: 3.7 g/dL (ref 3.5–5.0)
Alkaline Phosphatase: 91 U/L (ref 38–126)
Anion gap: 8 (ref 5–15)
BUN: 12 mg/dL (ref 6–20)
CO2: 30 mmol/L (ref 22–32)
Calcium: 8.6 mg/dL — ABNORMAL LOW (ref 8.9–10.3)
Chloride: 99 mmol/L (ref 98–111)
Creatinine, Ser: 0.63 mg/dL (ref 0.44–1.00)
GFR calc Af Amer: >60 mL/min (ref >60)
GFR calc non Af Amer: >60 mL/min (ref >60)
Glucose, Bld: 227 mg/dL — ABNORMAL HIGH (ref 70–99)
Potassium: 4.4 mmol/L (ref 3.5–5.1)
Sodium: 137 mmol/L (ref 135–145)
Total Bilirubin: 0.8 mg/dL (ref 0.3–1.2)
Total Protein: 8 g/dL (ref 6.5–8.1)

## 2020-04-06 LAB — PROCALCITONIN: Procalcitonin: <0.1 ng/mL

## 2020-04-06 LAB — ABO/RH: ABO/RH(D): AB POS

## 2020-04-06 LAB — PHOSPHORUS: Phosphorus: 3.5 mg/dL (ref 2.5–4.6)

## 2020-04-06 LAB — FIBRIN DERIVATIVES D-DIMER (ARMC ONLY): Fibrin derivatives D-dimer (ARMC): 1050.66 ng/mL (FEU) — ABNORMAL HIGH (ref 0.00–499.00)

## 2020-04-06 LAB — MAGNESIUM: Magnesium: 2.1 mg/dL (ref 1.7–2.4)

## 2020-04-06 LAB — HIV ANTIBODY (ROUTINE TESTING W REFLEX): HIV Screen 4th Generation wRfx: NONREACTIVE

## 2020-04-06 LAB — FIBRINOGEN: Fibrinogen: 599 mg/dL — ABNORMAL HIGH (ref 210–475)

## 2020-04-06 LAB — C-REACTIVE PROTEIN: CRP: 8 mg/dL — ABNORMAL HIGH (ref <1.0)

## 2020-04-06 LAB — LACTATE DEHYDROGENASE: LDH: 193 U/L — ABNORMAL HIGH (ref 98–192)

## 2020-04-06 LAB — GLUCOSE, CAPILLARY
Glucose-Capillary: 157 mg/dL — ABNORMAL HIGH (ref 70–99)
Glucose-Capillary: 162 mg/dL — ABNORMAL HIGH (ref 70–99)

## 2020-04-06 MED ORDER — VITAMIN C 500 MG/5ML PO SYRP
500.0000 mg | ORAL_SOLUTION | Freq: Every day | ORAL | Status: DC
  Administered 2020-04-06 – 2020-04-09 (×4): 500 mg via ORAL
  Filled 2020-04-06 (×5): qty 5

## 2020-04-06 MED ORDER — SODIUM CHLORIDE 0.9 % IV SOLN
2.0000 g | INTRAVENOUS | Status: DC
  Administered 2020-04-07 – 2020-04-09 (×3): 2 g via INTRAVENOUS
  Filled 2020-04-06 (×4): qty 20

## 2020-04-06 MED ORDER — INSULIN ASPART 100 UNIT/ML ~~LOC~~ SOLN
0.0000 [IU] | Freq: Every day | SUBCUTANEOUS | Status: DC
  Administered 2020-04-07: 21:00:00 2 [IU] via SUBCUTANEOUS

## 2020-04-06 MED ORDER — LEVOFLOXACIN IN D5W 750 MG/150ML IV SOLN
750.0000 mg | INTRAVENOUS | Status: DC
  Administered 2020-04-06: 11:00:00 750 mg via INTRAVENOUS
  Filled 2020-04-06: qty 150

## 2020-04-06 MED ORDER — LORAZEPAM 1 MG PO TABS
1.0000 mg | ORAL_TABLET | Freq: Once | ORAL | Status: AC
  Administered 2020-04-06: 1 mg via ORAL
  Filled 2020-04-06: qty 1

## 2020-04-06 MED ORDER — SODIUM CHLORIDE 0.9 % IV SOLN
100.0000 mg | Freq: Two times a day (BID) | INTRAVENOUS | Status: DC
  Administered 2020-04-07 – 2020-04-09 (×5): 100 mg via INTRAVENOUS
  Filled 2020-04-06 (×7): qty 100

## 2020-04-06 MED ORDER — SODIUM CHLORIDE 0.9 % IV SOLN: 100.0000 mg | Freq: Two times a day (BID) | INTRAVENOUS | Status: DC

## 2020-04-06 MED ORDER — SODIUM CHLORIDE 0.9 % IV SOLN: 2.0000 g | INTRAVENOUS | Status: DC

## 2020-04-06 MED ORDER — SODIUM CHLORIDE 0.9 % IV SOLN
INTRAVENOUS | Status: DC | PRN
  Administered 2020-04-06: 11:00:00 500 mL via INTRAVENOUS

## 2020-04-06 MED ORDER — ZINC SULFATE 220 (50 ZN) MG PO CAPS
220.0000 mg | ORAL_CAPSULE | Freq: Every day | ORAL | Status: DC
  Administered 2020-04-06 – 2020-04-09 (×4): 220 mg via ORAL
  Filled 2020-04-06 (×4): qty 1

## 2020-04-06 MED ORDER — INSULIN ASPART 100 UNIT/ML ~~LOC~~ SOLN
0.0000 [IU] | Freq: Three times a day (TID) | SUBCUTANEOUS | Status: DC
  Administered 2020-04-06: 18:00:00 2 [IU] via SUBCUTANEOUS
  Administered 2020-04-07 – 2020-04-08 (×4): 1 [IU] via SUBCUTANEOUS
  Filled 2020-04-06 (×6): qty 1

## 2020-04-06 NOTE — Progress Notes (Signed)
Pt through out the night would have intermittent episodes of apnea, in which her O2 sat would drop to the 60-70's.   On the first occurrence around 2am, I asked patient if she had ever been diagnosed with sleep apnea. She said, "Yeah, but I can't wear those masks. I've tried all of them and I just can't wear it". I paged Webb Silversmith, NP. She told me to ask patient if she would be willing to wear a Bipap. The patient refused. I bumped up the pt's oxygen from 2L to 6L. Otherwise, due to her refusal and awareness of how badly her oxygen levels drop while she sleeps and becomes apenic, there isn't much more we can do.  Will continue to monitor pt.

## 2020-04-06 NOTE — Progress Notes (Addendum)
PROGRESS NOTE    Olivia Leonard  ZHG:[id number] DOB: May 02, 1966 DOA: 04/05/2020 PCP: Abram Sander, MD   Brief Narrative: 54 year old with past medical history significant for hypothyroidism, asthma, allergic rhinitis, GERD, tobacco use who presents complaining of shortness of breath and hemoptysis over the past day.  She has been feeling poorly over the last 5 days with myalgia, fever, diarrhea and cough.  She tested positive for Covid on 8/5.  She is not vaccinated.  Evaluation in the ED: Patient was tachypneic oxygen saturation 95 on 2 L, understanding oxygen saturation 87%.  Chest x-ray was negative.  CTA negative for PE, showed findings consistent with Extensive perihilar airspace infiltrate and consolidation, more prevalent within the right upper lobe and lingula most in keeping with changes of acute multifocal bronchopneumonia. The findings are not typical of that seen with COVID-19 pneumonia.    Assessment & Plan:   Active Problems:   COVID-19   Hypoxia   Hemoptysis   Asthma   Allergic rhinitis   Tobacco use  1-Acute Hypoxic Respiratory failure; secondary to COVID-19 pneumonia: COVID-19 positive Fibrinogen 599, D-dimer 1000, CT angio negative for PE, extensive perihilar airspace infiltrates and consolidation within the right upper lobe and lingula almost keeping with changes of acute multifocal bronchopneumonia. Continue with Remdesivir and Dexamethasone.  Add IV ceftriaxone and Doxy to cover for PNA>  Continue with Tussionex Advised patient to lay down in prone  position.  Start zinc and vitamin C Currently on HF oxygen 4--6 L.  2-asthma/allergic rhinitis Continue with albuterol  3-tobacco use: She has not smoked in 2 days.  Estimated body mass index is 45.84 kg/m as calculated from the following:   Height as of this encounter: 5\' 6"  (1.676 m).   Weight as of this encounter: 128.8 kg.   DVT prophylaxis: Lovenox Code Status: Full code Family Communication:  Care discussed with patient Disposition Plan:  Status is: Inpatient  Remains inpatient appropriate because:Hemodynamically unstable   Dispo: The patient is from: Home              Anticipated d/c is to: Home              Anticipated d/c date is: 2 days              Patient currently is not medically stable to d/c.        Consultants:   None  Procedures:   None  Antimicrobials:  Ceftriaxone and doxycycline  Subjective: Patient reports shortness of breath and cough.  She also report hemoptysis, mild. He denies chest pain. I inform her CTA results, negative for PE, findings consistent with pneumonia. Discussed option for Actemra, in case her oxygen requirement increased.  Patient would like to avoid this medication if possible, she does not want any experimental drugs.  Objective: Vitals:   04/06/20 0730 04/06/20 0732 04/06/20 0820 04/06/20 1140  BP:   (!) 143/90 129/63  Pulse:   83 69  Resp:   17   Temp:   98.1 F (36.7 C) 98 F (36.7 C)  TempSrc:   Oral Oral  SpO2: (!) 73% 94% 93% 92%  Weight:      Height:       No intake or output data in the 24 hours ending 04/06/20 1411 Filed Weights   04/05/20 1106  Weight: 128.8 kg    Examination:  General exam: Appears calm and comfortable  Respiratory system: Bilateral ronchus Respiratory effort normal. Cardiovascular system: S1 & S2 heard, RRR.  No JVD, murmurs, rubs, gallops or clicks. No pedal edema. Gastrointestinal system: Abdomen is nondistended, soft and nontender. No organomegaly or masses felt. Normal bowel sounds heard. Central nervous system: Alert and oriented. No focal neurological deficits. Extremities: Symmetric 5 x 5 power. Skin: No rashes, lesions or ulcers    Data Reviewed: I have personally reviewed following labs and imaging studies  CBC: Recent Labs  Lab 04/05/20 1158 04/06/20 0557  WBC 6.0 4.3  NEUTROABS  --  3.5  HGB 14.1 14.6  HCT 47.7* 48.4*  MCV 81.7 81.5  PLT 160 154    Basic Metabolic Panel: Recent Labs  Lab 04/05/20 1158 04/05/20 1911 04/06/20 0557  NA 137  --  137  K 4.2  --  4.4  CL 98  --  99  CO2 28  --  30  GLUCOSE 113*  --  227*  BUN 9  --  12  CREATININE 0.50  --  0.63  CALCIUM 8.7*  --  8.6*  MG  --  2.0 2.1  PHOS  --   --  3.5   GFR: Estimated Creatinine Clearance: 113.1 mL/min (by C-G formula based on SCr of 0.63 mg/dL). Liver Function Tests: Recent Labs  Lab 04/05/20 1158 04/06/20 0557  AST 26 36  ALT 22 25  ALKPHOS 85 91  BILITOT 0.9 0.8  PROT 7.3 8.0  ALBUMIN 3.5 3.7   No results for input(s): LIPASE, AMYLASE in the last 168 hours. No results for input(s): AMMONIA in the last 168 hours. Coagulation Profile: No results for input(s): INR, PROTIME in the last 168 hours. Cardiac Enzymes: No results for input(s): CKTOTAL, CKMB, CKMBINDEX, TROPONINI in the last 168 hours. BNP (last 3 results) No results for input(s): PROBNP in the last 8760 hours. HbA1C: No results for input(s): HGBA1C in the last 72 hours. CBG: No results for input(s): GLUCAP in the last 168 hours. Lipid Profile: No results for input(s): CHOL, HDL, LDLCALC, TRIG, CHOLHDL, LDLDIRECT in the last 72 hours. Thyroid Function Tests: No results for input(s): TSH, T4TOTAL, FREET4, T3FREE, THYROIDAB in the last 72 hours. Anemia Panel: Recent Labs    04/06/20 0557  FERRITIN 30   Sepsis Labs: Recent Labs  Lab 04/06/20 0557  PROCALCITON <0.10    Recent Results (from the past 240 hour(s))  SARS Coronavirus 2 by RT PCR (hospital order, performed in [facility] hospital lab) Nasopharyngeal Nasopharyngeal Swab     Status: Abnormal   Collection Time: 04/05/20  7:51 PM   Specimen: Nasopharyngeal Swab  Result Value Ref Range Status   SARS Coronavirus 2 POSITIVE (A) NEGATIVE Final    Comment: RESULT CALLED TO, READ BACK BY AND VERIFIED WITH: Nita Sells 04/05/20 AT 2214 BY AR (NOTE) SARS-CoV-2 target nucleic acids are DETECTED  SARS-CoV-2 RNA is  generally detectable in upper respiratory specimens  during the acute phase of infection.  Positive results are indicative  of the presence of the identified virus, but do not rule out bacterial infection or co-infection with other pathogens not detected by the test.  Clinical correlation with patient history and  other diagnostic information is necessary to determine patient infection status.  The expected result is negative.  Fact Sheet for Patients:   BoilerBrush.com.cy   Fact Sheet for Healthcare Providers:   https://pope.com/    This test is not yet approved or cleared by the [place] FDA and  has been authorized for detection and/or diagnosis of SARS-CoV-2 by FDA under an Emergency Use Authorization (EUA).  This EUA will remain in effect (meaning this  test can be used) for the duration of  the COVID-19 declaration under Section 564(b)(1) of the Act, 21 U.S.C. section 360-bbb-3(b)(1), unless the authorization is terminated or revoked sooner.  Performed at [facility], [address] [place], [place] [address]          Radiology Studies: DG Chest 2 View  Result Date: 04/05/2020 CLINICAL DATA:  Shortness of breath, hypoxia, COVID-19 positive EXAM: CHEST - 2 VIEW COMPARISON:  08/15/2007 FINDINGS: The heart size and mediastinal contours are within normal limits. Streaky bilateral interstitial opacities within the perihilar and bibasilar regions. No pleural effusion. No pneumothorax. The visualized skeletal structures are unremarkable. IMPRESSION: Streaky bilateral perihilar and bibasilar interstitial opacities suspicious for atypical/viral pneumonia. Electronically Signed   By: Duanne Guess D.O.   On: 04/05/2020 11:34   CT Angio Chest PE W and/or Wo Contrast  Result Date: 04/05/2020 CLINICAL DATA:  Dyspnea, hemoptysis EXAM: CT ANGIOGRAPHY CHEST WITH CONTRAST TECHNIQUE: Multidetector CT imaging of the chest was  performed using the standard protocol during bolus administration of intravenous contrast. Multiplanar CT image reconstructions and MIPs were obtained to evaluate the vascular anatomy. CONTRAST:  OMNIPAQUE IOHEXOL 350 MG/ML SOLN COMPARISON:  06/03/2007 FINDINGS: Cardiovascular: Satisfactory opacification of the pulmonary arteries to the segmental level. No evidence of pulmonary embolism. No significant coronary artery calcification. Normal heart size. No pericardial effusion. Thoracic aorta is unremarkable. Mediastinum/Nodes: There is extensive mediastinal and bilateral hilar adenopathy, possibly reactive in nature given the associated findings. The esophagus is unremarkable. Thyroid unremarkable. Lungs/Pleura: There is extensive perihilar airspace infiltrate and consolidation, more prevalent within the right upper lobe and lingula most in keeping with changes of acute multifocal bronchopneumonia. No pneumothorax or pleural effusion. The central airways are widely patent. Upper Abdomen: At least mild hepatic steatosis is noted. Musculoskeletal: No acute bone abnormalities. Review of the MIP images confirms the above findings. IMPRESSION: 1. No evidence of acute pulmonary embolism. 2. Extensive perihilar airspace infiltrate and consolidation, more prevalent within the right upper lobe and lingula most in keeping with changes of acute multifocal bronchopneumonia. The findings are not typical of that seen with COVID-19 pneumonia. 3. Extensive mediastinal and bilateral hilar adenopathy, possibly reactive in nature. 4. At least mild hepatic steatosis. Electronically Signed   By: Helyn Numbers MD   On: 04/05/2020 21:13        Scheduled Meds: . albuterol  2 puff Inhalation Q6H  . dexamethasone (DECADRON) injection  6 mg Intravenous Q24H  . enoxaparin (LOVENOX) injection  40 mg Subcutaneous BID  . insulin aspart  0-5 Units Subcutaneous QHS  . insulin aspart  0-9 Units Subcutaneous TID WC  . levothyroxine   175 mcg Oral QAC breakfast  . loratadine  10 mg Oral Daily  . pantoprazole  40 mg Oral Daily   Continuous Infusions: . sodium chloride 500 mL (04/06/20 1119)  . [START ON 04/07/2020] cefTRIAXone (ROCEPHIN)  IV    . [START ON 04/07/2020] doxycycline (VIBRAMYCIN) IV    . remdesivir 100 mg in NS 100 mL 100 mg (04/06/20 0958)     LOS: 1 day    Time spent: 35 minutes,      Sloan Takagi A Camile Esters, MD [facility]   If 7PM-7AM, please contact night-coverage [email or web address]  04/06/2020, 2:11 PM

## 2020-04-07 LAB — COMPREHENSIVE METABOLIC PANEL
ALT: 22 U/L (ref 0–44)
AST: 26 U/L (ref 15–41)
Albumin: 3.5 g/dL (ref 3.5–5.0)
Alkaline Phosphatase: 82 U/L (ref 38–126)
Anion gap: 9 (ref 5–15)
BUN: 15 mg/dL (ref 6–20)
CO2: 28 mmol/L (ref 22–32)
Calcium: 9.2 mg/dL (ref 8.9–10.3)
Chloride: 104 mmol/L (ref 98–111)
Creatinine, Ser: 0.6 mg/dL (ref 0.44–1.00)
GFR calc Af Amer: >60 mL/min (ref >60)
GFR calc non Af Amer: >60 mL/min (ref >60)
Glucose, Bld: 125 mg/dL — ABNORMAL HIGH (ref 70–99)
Potassium: 4.4 mmol/L (ref 3.5–5.1)
Sodium: 141 mmol/L (ref 135–145)
Total Bilirubin: 0.6 mg/dL (ref 0.3–1.2)
Total Protein: 7.7 g/dL (ref 6.5–8.1)

## 2020-04-07 LAB — CBC WITH DIFFERENTIAL/PLATELET
Abs Immature Granulocytes: 0.04 10*3/uL (ref 0.00–0.07)
Basophils Absolute: 0 10*3/uL (ref 0.0–0.1)
Basophils Relative: 0 %
Eosinophils Absolute: 0 10*3/uL (ref 0.0–0.5)
Eosinophils Relative: 0 %
HCT: 45.9 % (ref 36.0–46.0)
Hemoglobin: 14 g/dL (ref 12.0–15.0)
Immature Granulocytes: 1 %
Lymphocytes Relative: 21 %
Lymphs Abs: 1.4 10*3/uL (ref 0.7–4.0)
MCH: 24.5 pg — ABNORMAL LOW (ref 26.0–34.0)
MCHC: 30.5 g/dL (ref 30.0–36.0)
MCV: 80.2 fL (ref 80.0–100.0)
Monocytes Absolute: 0.7 10*3/uL (ref 0.1–1.0)
Monocytes Relative: 11 %
Neutro Abs: 4.4 10*3/uL (ref 1.7–7.7)
Neutrophils Relative %: 67 %
Platelets: 182 10*3/uL (ref 150–400)
RBC: 5.72 MIL/uL — ABNORMAL HIGH (ref 3.87–5.11)
RDW: 17.4 % — ABNORMAL HIGH (ref 11.5–15.5)
WBC: 6.5 10*3/uL (ref 4.0–10.5)
nRBC: 0 % (ref 0.0–0.2)

## 2020-04-07 LAB — HEMOGLOBIN A1C
Hgb A1c MFr Bld: 6.8 % — ABNORMAL HIGH (ref 4.8–5.6)
Mean Plasma Glucose: 148 mg/dL

## 2020-04-07 LAB — GLUCOSE, CAPILLARY
Glucose-Capillary: 129 mg/dL — ABNORMAL HIGH (ref 70–99)
Glucose-Capillary: 171 mg/dL — ABNORMAL HIGH (ref 70–99)
Glucose-Capillary: 139 mg/dL — ABNORMAL HIGH (ref 70–99)
Glucose-Capillary: 225 mg/dL — ABNORMAL HIGH (ref 70–99)

## 2020-04-07 LAB — MAGNESIUM: Magnesium: 1.9 mg/dL (ref 1.7–2.4)

## 2020-04-07 LAB — PHOSPHORUS: Phosphorus: 3.9 mg/dL (ref 2.5–4.6)

## 2020-04-07 LAB — FIBRIN DERIVATIVES D-DIMER (ARMC ONLY): Fibrin derivatives D-dimer (ARMC): 888.94 ng/mL (FEU) — ABNORMAL HIGH (ref 0.00–499.00)

## 2020-04-07 LAB — C-REACTIVE PROTEIN: CRP: 3.8 mg/dL — ABNORMAL HIGH (ref <1.0)

## 2020-04-07 LAB — FERRITIN: Ferritin: 28 ng/mL (ref 11–307)

## 2020-04-07 MED ORDER — DEXAMETHASONE SODIUM PHOSPHATE 10 MG/ML IJ SOLN
6.0000 mg | INTRAMUSCULAR | Status: DC
  Administered 2020-04-07 – 2020-04-09 (×3): 6 mg via INTRAVENOUS
  Filled 2020-04-07 (×3): qty 1

## 2020-04-07 MED ORDER — POLYETHYLENE GLYCOL 3350 17 G PO PACK: 17.0000 g | PACK | Freq: Every day | ORAL | Status: DC | PRN

## 2020-04-07 NOTE — Progress Notes (Signed)
Patient refused pm scheduled lovenox. Patient education provided.

## 2020-04-07 NOTE — Progress Notes (Signed)
PROGRESS NOTE    Olivia Leonard  HKV:[id number] DOB: June 03, 1966 DOA: 04/05/2020 PCP: Abram Sander, MD   Brief Narrative: 54 year old with past medical history significant for hypothyroidism, asthma, allergic rhinitis, GERD, tobacco use who presents complaining of shortness of breath and hemoptysis over the past day.  She has been feeling poorly over the last 5 days with myalgia, fever, diarrhea and cough.  She tested positive for Covid on 8/5.  She is not vaccinated.  Evaluation in the ED: Patient was tachypneic oxygen saturation 95 on 2 L, understanding oxygen saturation 87%.  Chest x-ray was negative.  CTA negative for PE, showed findings consistent with Extensive perihilar airspace infiltrate and consolidation, more prevalent within the right upper lobe and lingula most in keeping with changes of acute multifocal bronchopneumonia. The findings are not typical of that seen with COVID-19 pneumonia.    Assessment & Plan:   Active Problems:   COVID-19   Hypoxia   Hemoptysis   Asthma   Allergic rhinitis   Tobacco use  1-Acute Hypoxic Respiratory failure; secondary to COVID-19 pneumonia: COVID-19 positive Fibrinogen 599, D-dimer 1000, CT angio negative for PE, extensive perihilar airspace infiltrates and consolidation within the right upper lobe and lingula almost keeping with changes of acute multifocal bronchopneumonia. Continue with Remdesivir day 3. and Dexamethasone.  Continue with IV ceftriaxone and Doxy to cover for PNA>  Continue with Tussionex Advised patient to lay down in prone  position.  Continue  zinc and vitamin C Currently on 4 L  Oxygen.  Inflammatory Markers trending down.   2-Asthma/allergic rhinitis Continue with albuterol  3-tobacco use: She has not smoked in 2 days.  4-Morbid Obesity; needs life style modifications.   Estimated body mass index is 45.84 kg/m as calculated from the following:   Height as of this encounter: 5\' 6"  (1.676 m).   Weight  as of this encounter: 128.8 kg.   DVT prophylaxis: Lovenox, refuse DVT prophylaxis.  Code Status: Full code Family Communication: Care discussed with patient Disposition Plan:  Status is: Inpatient  Remains inpatient appropriate because:Hemodynamically unstable   Dispo: The patient is from: Home              Anticipated d/c is to: Home              Anticipated d/c date is: 2 days              Patient currently is not medically stable to d/c.        Consultants:   None  Procedures:   None  Antimicrobials:  Ceftriaxone and doxycycline  Subjective: Patient remain stable on 4 L oxygen. No worsening dyspnea.  Discussed importance of DVT prophylaxis and importance of dexamethasone.   Objective: Vitals:   04/06/20 2100 04/07/20 0427 04/07/20 0809 04/07/20 1208  BP: (!) 148/76 (!) 157/91 (!) 149/84 130/67  Pulse: 86 82 (!) 58 70  Resp: 18 15 16 18   Temp: 98.1 F (36.7 C) 98.1 F (36.7 C) 98 F (36.7 C) 98.6 F (37 C)  TempSrc: Oral  Oral Oral  SpO2: 91% 94% 96% 93%  Weight:      Height:        Intake/Output Summary (Last 24 hours) at 04/07/2020 1425 Last data filed at 04/07/2020 1033 Gross per 24 hour  Intake 240 ml  Output 300 ml  Net -60 ml   Filed Weights   04/05/20 1106  Weight: 128.8 kg    Examination:  General exam: NAD Respiratory system:  Bilateral ronchus Cardiovascular system: S 1, S 2 RRR Gastrointestinal system: BS present, soft, nt Central nervous system: alert, follows command Extremities: no edema    Data Reviewed: I have personally reviewed following labs and imaging studies  CBC: Recent Labs  Lab 04/05/20 1158 04/06/20 0557 04/07/20 0614  WBC 6.0 4.3 6.5  NEUTROABS  --  3.5 4.4  HGB 14.1 14.6 14.0  HCT 47.7* 48.4* 45.9  MCV 81.7 81.5 80.2  PLT 160 154 182   Basic Metabolic Panel: Recent Labs  Lab 04/05/20 1158 04/05/20 1911 04/06/20 0557 04/07/20 0614  NA 137  --  137 141  K 4.2  --  4.4 4.4  CL 98  --  99 104   CO2 28  --  30 28  GLUCOSE 113*  --  227* 125*  BUN 9  --  12 15  CREATININE 0.50  --  0.63 0.60  CALCIUM 8.7*  --  8.6* 9.2  MG  --  2.0 2.1 1.9  PHOS  --   --  3.5 3.9   GFR: Estimated Creatinine Clearance: 113.1 mL/min (by C-G formula based on SCr of 0.6 mg/dL). Liver Function Tests: Recent Labs  Lab 04/05/20 1158 04/06/20 0557 04/07/20 0614  AST 26 36 26  ALT 22 25 22   ALKPHOS 85 91 82  BILITOT 0.9 0.8 0.6  PROT 7.3 8.0 7.7  ALBUMIN 3.5 3.7 3.5   No results for input(s): LIPASE, AMYLASE in the last 168 hours. No results for input(s): AMMONIA in the last 168 hours. Coagulation Profile: No results for input(s): INR, PROTIME in the last 168 hours. Cardiac Enzymes: No results for input(s): CKTOTAL, CKMB, CKMBINDEX, TROPONINI in the last 168 hours. BNP (last 3 results) No results for input(s): PROBNP in the last 8760 hours. HbA1C: Recent Labs    04/06/20 0557  HGBA1C 6.8*   CBG: Recent Labs  Lab 04/06/20 1732 04/06/20 2130 04/07/20 0806 04/07/20 1206  GLUCAP 157* 162* 129* 171*   Lipid Profile: No results for input(s): CHOL, HDL, LDLCALC, TRIG, CHOLHDL, LDLDIRECT in the last 72 hours. Thyroid Function Tests: No results for input(s): TSH, T4TOTAL, FREET4, T3FREE, THYROIDAB in the last 72 hours. Anemia Panel: Recent Labs    04/06/20 0557 04/07/20 0614  FERRITIN 30 28   Sepsis Labs: Recent Labs  Lab 04/06/20 0557  PROCALCITON <0.10    Recent Results (from the past 240 hour(s))  SARS Coronavirus 2 by RT PCR (hospital order, performed in [facility] hospital lab) Nasopharyngeal Nasopharyngeal Swab     Status: Abnormal   Collection Time: 04/05/20  7:51 PM   Specimen: Nasopharyngeal Swab  Result Value Ref Range Status   SARS Coronavirus 2 POSITIVE (A) NEGATIVE Final    Comment: RESULT CALLED TO, READ BACK BY AND VERIFIED WITH: 06/05/20 04/05/20 AT 2214 BY AR (NOTE) SARS-CoV-2 target nucleic acids are DETECTED  SARS-CoV-2 RNA is generally  detectable in upper respiratory specimens  during the acute phase of infection.  Positive results are indicative  of the presence of the identified virus, but do not rule out bacterial infection or co-infection with other pathogens not detected by the test.  Clinical correlation with patient history and  other diagnostic information is necessary to determine patient infection status.  The expected result is negative.  Fact Sheet for Patients:   2215   Fact Sheet for Healthcare Providers:   BoilerBrush.com.cy    This test is not yet approved or cleared by the https://pope.com/ FDA and  has been authorized for detection and/or diagnosis of SARS-CoV-2 by FDA under an Emergency Use Authorization (EUA).  This EUA will remain in effect (meaning this  test can be used) for the duration of  the COVID-19 declaration under Section 564(b)(1) of the Act, 21 U.S.C. section 360-bbb-3(b)(1), unless the authorization is terminated or revoked sooner.  Performed at [facility], [address] [place], [place] [address]          Radiology Studies: CT Angio Chest PE W and/or Wo Contrast  Result Date: 04/05/2020 CLINICAL DATA:  Dyspnea, hemoptysis EXAM: CT ANGIOGRAPHY CHEST WITH CONTRAST TECHNIQUE: Multidetector CT imaging of the chest was performed using the standard protocol during bolus administration of intravenous contrast. Multiplanar CT image reconstructions and MIPs were obtained to evaluate the vascular anatomy. CONTRAST:  OMNIPAQUE IOHEXOL 350 MG/ML SOLN COMPARISON:  06/03/2007 FINDINGS: Cardiovascular: Satisfactory opacification of the pulmonary arteries to the segmental level. No evidence of pulmonary embolism. No significant coronary artery calcification. Normal heart size. No pericardial effusion. Thoracic aorta is unremarkable. Mediastinum/Nodes: There is extensive mediastinal and bilateral hilar adenopathy,  possibly reactive in nature given the associated findings. The esophagus is unremarkable. Thyroid unremarkable. Lungs/Pleura: There is extensive perihilar airspace infiltrate and consolidation, more prevalent within the right upper lobe and lingula most in keeping with changes of acute multifocal bronchopneumonia. No pneumothorax or pleural effusion. The central airways are widely patent. Upper Abdomen: At least mild hepatic steatosis is noted. Musculoskeletal: No acute bone abnormalities. Review of the MIP images confirms the above findings. IMPRESSION: 1. No evidence of acute pulmonary embolism. 2. Extensive perihilar airspace infiltrate and consolidation, more prevalent within the right upper lobe and lingula most in keeping with changes of acute multifocal bronchopneumonia. The findings are not typical of that seen with COVID-19 pneumonia. 3. Extensive mediastinal and bilateral hilar adenopathy, possibly reactive in nature. 4. At least mild hepatic steatosis. Electronically Signed   By: Helyn Numbers MD   On: 04/05/2020 21:13        Scheduled Meds:  albuterol  2 puff Inhalation Q6H   ascorbic acid  500 mg Oral Daily   dexamethasone (DECADRON) injection  6 mg Intravenous Q24H   enoxaparin (LOVENOX) injection  40 mg Subcutaneous BID   insulin aspart  0-5 Units Subcutaneous QHS   insulin aspart  0-9 Units Subcutaneous TID WC   levothyroxine  175 mcg Oral QAC breakfast   loratadine  10 mg Oral Daily   pantoprazole  40 mg Oral Daily   zinc sulfate  220 mg Oral Daily   Continuous Infusions:  sodium chloride 500 mL (04/06/20 1119)   cefTRIAXone (ROCEPHIN)  IV 2 g (04/07/20 0823)   doxycycline (VIBRAMYCIN) IV 100 mg (04/07/20 0837)   remdesivir 100 mg in NS 100 mL 100 mg (04/06/20 0958)     LOS: 2 days    Time spent: 35 minutes,      Lilburn Straw A Tyronn Golda, MD [facility]   If 7PM-7AM, please contact night-coverage [email or web address]  04/07/2020, 2:25 PM

## 2020-04-07 NOTE — Progress Notes (Signed)
Patient refused her 2345 scheduled Decadron.

## 2020-04-07 NOTE — Plan of Care (Signed)

## 2020-04-08 LAB — COMPREHENSIVE METABOLIC PANEL
ALT: 20 U/L (ref 0–44)
AST: 24 U/L (ref 15–41)
Albumin: 3.4 g/dL — ABNORMAL LOW (ref 3.5–5.0)
Alkaline Phosphatase: 76 U/L (ref 38–126)
Anion gap: 11 (ref 5–15)
BUN: 14 mg/dL (ref 6–20)
CO2: 28 mmol/L (ref 22–32)
Calcium: 9.1 mg/dL (ref 8.9–10.3)
Chloride: 101 mmol/L (ref 98–111)
Creatinine, Ser: 0.51 mg/dL (ref 0.44–1.00)
GFR calc Af Amer: >60 mL/min (ref >60)
GFR calc non Af Amer: >60 mL/min (ref >60)
Glucose, Bld: 200 mg/dL — ABNORMAL HIGH (ref 70–99)
Potassium: 4.3 mmol/L (ref 3.5–5.1)
Sodium: 140 mmol/L (ref 135–145)
Total Bilirubin: 0.7 mg/dL (ref 0.3–1.2)
Total Protein: 7.4 g/dL (ref 6.5–8.1)

## 2020-04-08 LAB — CBC WITH DIFFERENTIAL/PLATELET
Abs Immature Granulocytes: 0.04 10*3/uL (ref 0.00–0.07)
Basophils Absolute: 0 10*3/uL (ref 0.0–0.1)
Basophils Relative: 0 %
Eosinophils Absolute: 0 10*3/uL (ref 0.0–0.5)
Eosinophils Relative: 0 %
HCT: 45.2 % (ref 36.0–46.0)
Hemoglobin: 13.3 g/dL (ref 12.0–15.0)
Immature Granulocytes: 1 %
Lymphocytes Relative: 21 %
Lymphs Abs: 1.3 10*3/uL (ref 0.7–4.0)
MCH: 24.6 pg — ABNORMAL LOW (ref 26.0–34.0)
MCHC: 29.4 g/dL — ABNORMAL LOW (ref 30.0–36.0)
MCV: 83.5 fL (ref 80.0–100.0)
Monocytes Absolute: 0.5 10*3/uL (ref 0.1–1.0)
Monocytes Relative: 8 %
Neutro Abs: 4.1 10*3/uL (ref 1.7–7.7)
Neutrophils Relative %: 70 %
Platelets: 190 10*3/uL (ref 150–400)
RBC: 5.41 MIL/uL — ABNORMAL HIGH (ref 3.87–5.11)
RDW: 17.3 % — ABNORMAL HIGH (ref 11.5–15.5)
WBC: 5.9 10*3/uL (ref 4.0–10.5)
nRBC: 0 % (ref 0.0–0.2)

## 2020-04-08 LAB — GLUCOSE, CAPILLARY
Glucose-Capillary: 129 mg/dL — ABNORMAL HIGH (ref 70–99)
Glucose-Capillary: 136 mg/dL — ABNORMAL HIGH (ref 70–99)
Glucose-Capillary: 197 mg/dL — ABNORMAL HIGH (ref 70–99)

## 2020-04-08 LAB — MAGNESIUM: Magnesium: 1.9 mg/dL (ref 1.7–2.4)

## 2020-04-08 LAB — FIBRIN DERIVATIVES D-DIMER (ARMC ONLY): Fibrin derivatives D-dimer (ARMC): 665.05 ng/mL (FEU) — ABNORMAL HIGH (ref 0.00–499.00)

## 2020-04-08 LAB — FERRITIN: Ferritin: 22 ng/mL (ref 11–307)

## 2020-04-08 LAB — C-REACTIVE PROTEIN: CRP: 3.1 mg/dL — ABNORMAL HIGH (ref <1.0)

## 2020-04-08 LAB — PHOSPHORUS: Phosphorus: 4 mg/dL (ref 2.5–4.6)

## 2020-04-08 NOTE — Plan of Care (Signed)

## 2020-04-08 NOTE — Progress Notes (Signed)
PROGRESS NOTE    Olivia Leonard  MPN:[id number] DOB: [birth year] DOA: 04/05/2020 PCP: Abram Sander, MD   Brief Narrative: 54 year old with past medical history significant for hypothyroidism, asthma, allergic rhinitis, GERD, tobacco use who presents complaining of shortness of breath and hemoptysis over the past day.  She has been feeling poorly over the last 5 days with myalgia, fever, diarrhea and cough.  She tested positive for Covid on 8/5.  She is not vaccinated.  Evaluation in the ED: Patient was tachypneic oxygen saturation 95 on 2 L, understanding oxygen saturation 87%.  Chest x-ray was negative.  CTA negative for PE, showed findings consistent with Extensive perihilar airspace infiltrate and consolidation, more prevalent within the right upper lobe and lingula most in keeping with changes of acute multifocal bronchopneumonia. The findings are not typical of that seen with COVID-19 pneumonia.    Assessment & Plan:   Active Problems:   COVID-19   Hypoxia   Hemoptysis   Asthma   Allergic rhinitis   Tobacco use  1-Acute Hypoxic Respiratory failure; secondary to COVID-19 pneumonia: COVID-19 positive Fibrinogen 599, D-dimer 1000, CT angio negative for PE, extensive perihilar airspace infiltrates and consolidation within the right upper lobe and lingula almost keeping with changes of acute multifocal bronchopneumonia. Continue with Remdesivir day 4. and Dexamethasone.  Continue with IV ceftriaxone and Doxy to cover for PNA>  Continue with Tussionex Advised patient to lay down in prone  position.  Continue  zinc and vitamin C Currently on 4 L  Oxygen.  Inflammatory markers continue to decrease, CRP 3.1 from 3.85, D-dimer 665 from 888 Patient report persistent cough, and hemoptysis, denies worsening shortness of breath.  She is feeling tired today.  2-Asthma/allergic rhinitis Continue with albuterol  3-tobacco use: She has not smoked in 2 days.  4-Morbid Obesity; needs  life style modifications.   Estimated body mass index is 45.84 kg/m as calculated from the following:   Height as of this encounter: 5\' 6"  (1.676 m).   Weight as of this encounter: 128.8 kg.   DVT prophylaxis: Lovenox, refuse DVT prophylaxis.  Code Status: Full code Family Communication: Care discussed with patient Disposition Plan:  Status is: Inpatient  Remains inpatient appropriate because:Hemodynamically unstable   Dispo: The patient is from: Home              Anticipated d/c is to: Home              Anticipated d/c date is: 2 days              Patient currently is not medically stable to d/c.hopefully home 8/14. She will probably need to be discharge with oxygen.         Consultants:   None  Procedures:   None  Antimicrobials:  Ceftriaxone and doxycycline  Subjective: Patient is feeling tired. She is frustrated with IV making noise. She report persistent cough, small amount of blood.  She denies worsening dyspnea.   Objective: Vitals:   04/07/20 2018 04/08/20 0134 04/08/20 0527 04/08/20 0825  BP: (!) 146/78 123/73 (!) 142/64 (!) 147/82  Pulse: 70 63 (!) 57 69  Resp: 16 16 15 18   Temp: 98.7 F (37.1 C) 98.5 F (36.9 C) 97.6 F (36.4 C) 98.7 F (37.1 C)  TempSrc: Oral Oral Oral   SpO2: 94% 95% 94% 95%  Weight:      Height:       No intake or output data in the 24 hours ending 04/08/20 1514  Filed Weights   04/05/20 1106  Weight: 128.8 kg    Examination:  General exam: NAD Respiratory system: CTA Cardiovascular system: S 1, S 2 RRR Gastrointestinal system: BS present, soft,  nt Central nervous system: Alert Extremities: No edema    Data Reviewed: I have personally reviewed following labs and imaging studies  CBC: Recent Labs  Lab 04/05/20 1158 04/06/20 0557 04/07/20 0614 04/08/20 0509  WBC 6.0 4.3 6.5 5.9  NEUTROABS  --  3.5 4.4 4.1  HGB 14.1 14.6 14.0 13.3  HCT 47.7* 48.4* 45.9 45.2  MCV 81.7 81.5 80.2 83.5  PLT 160 154 182  190   Basic Metabolic Panel: Recent Labs  Lab 04/05/20 1158 04/05/20 1911 04/06/20 0557 04/07/20 0614 04/08/20 0509  NA 137  --  137 141 140  K 4.2  --  4.4 4.4 4.3  CL 98  --  99 104 101  CO2 28  --  30 28 28   GLUCOSE 113*  --  227* 125* 200*  BUN 9  --  12 15 14   CREATININE 0.50  --  0.63 0.60 0.51  CALCIUM 8.7*  --  8.6* 9.2 9.1  MG  --  2.0 2.1 1.9 1.9  PHOS  --   --  3.5 3.9 4.0   GFR: Estimated Creatinine Clearance: 113.1 mL/min (by C-G formula based on SCr of 0.51 mg/dL). Liver Function Tests: Recent Labs  Lab 04/05/20 1158 04/06/20 0557 04/07/20 0614 04/08/20 0509  AST 26 36 26 24  ALT 22 25 22 20   ALKPHOS 85 91 82 76  BILITOT 0.9 0.8 0.6 0.7  PROT 7.3 8.0 7.7 7.4  ALBUMIN 3.5 3.7 3.5 3.4*   No results for input(s): LIPASE, AMYLASE in the last 168 hours. No results for input(s): AMMONIA in the last 168 hours. Coagulation Profile: No results for input(s): INR, PROTIME in the last 168 hours. Cardiac Enzymes: No results for input(s): CKTOTAL, CKMB, CKMBINDEX, TROPONINI in the last 168 hours. BNP (last 3 results) No results for input(s): PROBNP in the last 8760 hours. HbA1C: Recent Labs    04/06/20 0557  HGBA1C 6.8*   CBG: Recent Labs  Lab 04/07/20 0806 04/07/20 1206 04/07/20 1645 04/07/20 2018 04/08/20 0824  GLUCAP 129* 171* 139* 225* 129*   Lipid Profile: No results for input(s): CHOL, HDL, LDLCALC, TRIG, CHOLHDL, LDLDIRECT in the last 72 hours. Thyroid Function Tests: No results for input(s): TSH, T4TOTAL, FREET4, T3FREE, THYROIDAB in the last 72 hours. Anemia Panel: Recent Labs    04/07/20 0614 04/08/20 0509  FERRITIN 28 22   Sepsis Labs: Recent Labs  Lab 04/06/20 0557  PROCALCITON <0.10    Recent Results (from the past 240 hour(s))  SARS Coronavirus 2 by RT PCR (hospital order, performed in [facility] hospital lab) Nasopharyngeal Nasopharyngeal Swab     Status: Abnormal   Collection Time: 04/05/20  7:51 PM   Specimen:  Nasopharyngeal Swab  Result Value Ref Range Status   SARS Coronavirus 2 POSITIVE (A) NEGATIVE Final    Comment: RESULT CALLED TO, READ BACK BY AND VERIFIED WITH: 06/06/20 04/05/20 AT 2214 BY AR (NOTE) SARS-CoV-2 target nucleic acids are DETECTED  SARS-CoV-2 RNA is generally detectable in upper respiratory specimens  during the acute phase of infection.  Positive results are indicative  of the presence of the identified virus, but do not rule out bacterial infection or co-infection with other pathogens not detected by the test.  Clinical correlation with patient history and  other diagnostic information  is necessary to determine patient infection status.  The expected result is negative.  Fact Sheet for Patients:   BoilerBrush.com.cy   Fact Sheet for Healthcare Providers:   https://pope.com/    This test is not yet approved or cleared by the [place] FDA and  has been authorized for detection and/or diagnosis of SARS-CoV-2 by FDA under an Emergency Use Authorization (EUA).  This EUA will remain in effect (meaning this  test can be used) for the duration of  the COVID-19 declaration under Section 564(b)(1) of the Act, 21 U.S.C. section 360-bbb-3(b)(1), unless the authorization is terminated or revoked sooner.  Performed at [facility], [address] [place], [place] [address]          Radiology Studies: No results found.      Scheduled Meds: . albuterol  2 puff Inhalation Q6H  . ascorbic acid  500 mg Oral Daily  . dexamethasone (DECADRON) injection  6 mg Intravenous Q24H  . enoxaparin (LOVENOX) injection  40 mg Subcutaneous BID  . insulin aspart  0-5 Units Subcutaneous QHS  . insulin aspart  0-9 Units Subcutaneous TID WC  . levothyroxine  175 mcg Oral QAC breakfast  . loratadine  10 mg Oral Daily  . pantoprazole  40 mg Oral Daily  . zinc sulfate  220 mg Oral Daily   Continuous Infusions: .  sodium chloride 500 mL (04/06/20 1119)  . cefTRIAXone (ROCEPHIN)  IV 2 g (04/08/20 0923)  . doxycycline (VIBRAMYCIN) IV 100 mg (04/08/20 1200)  . remdesivir 100 mg in NS 100 mL 100 mg (04/08/20 1100)     LOS: 3 days    Time spent: 35 minutes,      Dnyla Antonetti A Doriana Mazurkiewicz, MD [facility]   If 7PM-7AM, please contact night-coverage [email or web address]  04/08/2020, 3:14 PM

## 2020-04-09 LAB — COMPREHENSIVE METABOLIC PANEL
ALT: 23 U/L (ref 0–44)
AST: 26 U/L (ref 15–41)
Albumin: 3.4 g/dL — ABNORMAL LOW (ref 3.5–5.0)
Alkaline Phosphatase: 78 U/L (ref 38–126)
Anion gap: 10 (ref 5–15)
BUN: 15 mg/dL (ref 6–20)
CO2: 30 mmol/L (ref 22–32)
Calcium: 9.1 mg/dL (ref 8.9–10.3)
Chloride: 100 mmol/L (ref 98–111)
Creatinine, Ser: 0.5 mg/dL (ref 0.44–1.00)
GFR calc Af Amer: >60 mL/min (ref >60)
GFR calc non Af Amer: >60 mL/min (ref >60)
Glucose, Bld: 112 mg/dL — ABNORMAL HIGH (ref 70–99)
Potassium: 4.3 mmol/L (ref 3.5–5.1)
Sodium: 140 mmol/L (ref 135–145)
Total Bilirubin: 0.7 mg/dL (ref 0.3–1.2)
Total Protein: 7.2 g/dL (ref 6.5–8.1)

## 2020-04-09 LAB — CBC WITH DIFFERENTIAL/PLATELET
Abs Immature Granulocytes: 0.04 10*3/uL (ref 0.00–0.07)
Basophils Absolute: 0 10*3/uL (ref 0.0–0.1)
Basophils Relative: 0 %
Eosinophils Absolute: 0 10*3/uL (ref 0.0–0.5)
Eosinophils Relative: 0 %
HCT: 46.9 % — ABNORMAL HIGH (ref 36.0–46.0)
Hemoglobin: 13.6 g/dL (ref 12.0–15.0)
Immature Granulocytes: 1 %
Lymphocytes Relative: 33 %
Lymphs Abs: 2.1 10*3/uL (ref 0.7–4.0)
MCH: 24.1 pg — ABNORMAL LOW (ref 26.0–34.0)
MCHC: 29 g/dL — ABNORMAL LOW (ref 30.0–36.0)
MCV: 83 fL (ref 80.0–100.0)
Monocytes Absolute: 0.7 10*3/uL (ref 0.1–1.0)
Monocytes Relative: 11 %
Neutro Abs: 3.6 10*3/uL (ref 1.7–7.7)
Neutrophils Relative %: 55 %
Platelets: 201 10*3/uL (ref 150–400)
RBC: 5.65 MIL/uL — ABNORMAL HIGH (ref 3.87–5.11)
RDW: 16.9 % — ABNORMAL HIGH (ref 11.5–15.5)
WBC: 6.5 10*3/uL (ref 4.0–10.5)
nRBC: 0 % (ref 0.0–0.2)

## 2020-04-09 LAB — GLUCOSE, CAPILLARY
Glucose-Capillary: 105 mg/dL — ABNORMAL HIGH (ref 70–99)
Glucose-Capillary: 108 mg/dL — ABNORMAL HIGH (ref 70–99)

## 2020-04-09 LAB — FERRITIN: Ferritin: 23 ng/mL (ref 11–307)

## 2020-04-09 LAB — PHOSPHORUS: Phosphorus: 3.8 mg/dL (ref 2.5–4.6)

## 2020-04-09 LAB — FIBRIN DERIVATIVES D-DIMER (ARMC ONLY): Fibrin derivatives D-dimer (ARMC): 564.71 ng/mL (FEU) — ABNORMAL HIGH (ref 0.00–499.00)

## 2020-04-09 LAB — C-REACTIVE PROTEIN: CRP: 1.2 mg/dL — ABNORMAL HIGH (ref <1.0)

## 2020-04-09 LAB — MAGNESIUM: Magnesium: 2 mg/dL (ref 1.7–2.4)

## 2020-04-09 MED ORDER — VITAMIN C 500 MG/5ML PO SYRP: 500.0000 mg | ORAL_SOLUTION | Freq: Every day | ORAL | 12 refills | Status: AC

## 2020-04-09 MED ORDER — DEXAMETHASONE 6 MG PO TABS: 6.0000 mg | ORAL_TABLET | Freq: Every day | ORAL | 0 refills | Status: AC

## 2020-04-09 MED ORDER — DOXYCYCLINE HYCLATE 50 MG PO CAPS
100.0000 mg | ORAL_CAPSULE | Freq: Two times a day (BID) | ORAL | 0 refills | Status: AC
Start: 2020-04-09 — End: 2020-04-11

## 2020-04-09 MED ORDER — GUAIFENESIN-DM 100-10 MG/5ML PO SYRP: 10.0000 mL | ORAL_SOLUTION | ORAL | 0 refills | Status: AC | PRN

## 2020-04-09 MED ORDER — ZINC SULFATE 220 (50 ZN) MG PO CAPS: 220.0000 mg | ORAL_CAPSULE | Freq: Every day | ORAL | 0 refills | Status: AC

## 2020-04-09 MED ORDER — ALBUTEROL SULFATE HFA 108 (90 BASE) MCG/ACT IN AERS: 2.0000 | INHALATION_SPRAY | Freq: Four times a day (QID) | RESPIRATORY_TRACT | 0 refills | Status: AC

## 2020-04-09 MED ORDER — CEPHALEXIN 500 MG PO CAPS: 500.0000 mg | ORAL_CAPSULE | Freq: Two times a day (BID) | ORAL | 0 refills | Status: AC

## 2020-04-09 NOTE — Discharge Summary (Signed)
Physician Discharge Summary  Olivia Leonard WGY:[id number] DOB: 10-May-1966 DOA: 04/05/2020  PCP: Abram Sander, MD  Admit date: 04/05/2020 Discharge date: 04/09/2020  Admitted From: Home  Disposition:  Home   Recommendations for Outpatient Follow-up:  1. Follow up with PCP in 1-2 weeks 2. Please obtain BMP/CBC in one week 3. Needs to Quarantine until 8/26 4. Patient will be discharge on Oxygen 3 L.    Home Health: None Equipment/Devices: Oxygen   Discharge Condition: Stable.  CODE STATUS: Full code Diet recommendation: Heart Healthy   Brief/Interim Summary: 54 year old with past medical history significant for hypothyroidism, asthma, allergic rhinitis, GERD, tobacco use who presents complaining of shortness of breath and hemoptysis over the past day.  She has been feeling poorly over the last 5 days with myalgia, fever, diarrhea and cough.  She tested positive for Covid on 8/5.  She is not vaccinated.  Evaluation in the ED: Patient was tachypneic oxygen saturation 95 on 2 L, understanding oxygen saturation 87%.  Chest x-ray was negative.  CTA negative for PE, showed findings consistent with Extensive perihilar airspace infiltrate and consolidation, more prevalent within the right upper lobe and lingula most in keeping with changes of acute multifocal bronchopneumonia. The findings are not typical of that seen with COVID-19 pneumonia.   1-Acute Hypoxic Respiratory failure; Secondary to COVID-19 pneumonia: COVID-19 positive. Fibrinogen 599, D-dimer 1000/ CT angio negative for PE, extensive perihilar airspace infiltrates and consolidation within the right upper lobe and lingula almost keeping with changes of acute multifocal bronchopneumonia. She has completed Remdesivir day 5/5. and Dexamethasone. Plan to discharge on 5 days of dexamethasone.  Continue with IV ceftriaxone and Doxy to cover for PNA> Received 3 days. Plan to discharge on 2 more days of antibiotics.  Continue with  Tussionex Advised patient to lay down in prone  position.  Continue  zinc and vitamin C Currently on 3 L  Oxygen.  Inflammatory markers continue to decrease, CRP 3.1 from 3.85, D-dimer 665 from 888--- Patient report persistent cough, and hemoptysis, denies worsening shortness of breath.  She is feeling tired today.  2-Asthma/allergic rhinitis Continue with albuterol.  3-Tobacco use: She has not smoked in 2 days.  4-Morbid Obesity; needs life style modifications. Weight loss.   Discharge Diagnoses:  Active Problems:   COVID-19   Hypoxia   Hemoptysis   Asthma   Allergic rhinitis   Tobacco use    Discharge Instructions  Discharge Instructions    Diet - low sodium heart healthy   Complete by: As directed    Increase activity slowly   Complete by: As directed      Allergies as of 04/09/2020      Reactions   Biaxin [clarithromycin]       Medication List    TAKE these medications   albuterol 108 (90 Base) MCG/ACT inhaler Commonly known as: VENTOLIN HFA Inhale 2 puffs into the lungs every 6 (six) hours.   ascorbic acid 500 MG/5ML syrup Commonly known as: VITAMIN C Take 5 mLs (500 mg total) by mouth daily.   cephALEXin 500 MG capsule Commonly known as: KEFLEX Take 1 capsule (500 mg total) by mouth 2 (two) times daily for 2 days.   cetirizine 10 MG tablet Commonly known as: ZYRTEC Take 10 mg by mouth daily.   dexamethasone 6 MG tablet Commonly known as: DECADRON Take 1 tablet (6 mg total) by mouth daily.   doxycycline 50 MG capsule Commonly known as: VIBRAMYCIN Take 2 capsules (100 mg total) by  mouth 2 (two) times daily for 2 days.   guaiFENesin-dextromethorphan 100-10 MG/5ML syrup Commonly known as: ROBITUSSIN DM Take 10 mLs by mouth every 4 (four) hours as needed for cough.   levothyroxine 175 MCG tablet Commonly known as: SYNTHROID Take 175 mcg by mouth daily before breakfast.   OMEPRAZOLE PO Take by mouth.   zinc sulfate 220 (50 Zn) MG  capsule Take 1 capsule (220 mg total) by mouth daily.            Durable Medical Equipment  (From admission, onward)         Start     Ordered   04/08/20 1513  For home use only DME oxygen  Once       Question Answer Comment  Length of Need 6 Months   Mode or (Route) Nasal cannula   Liters per Minute 4   Frequency Continuous (stationary and portable oxygen unit needed)   Oxygen delivery system Gas      04/08/20 1513          Allergies  Allergen Reactions  . Biaxin [Clarithromycin]     Consultations:  None   Procedures/Studies: DG Chest 2 View  Result Date: 04/05/2020 CLINICAL DATA:  Shortness of breath, hypoxia, COVID-19 positive EXAM: CHEST - 2 VIEW COMPARISON:  08/15/2007 FINDINGS: The heart size and mediastinal contours are within normal limits. Streaky bilateral interstitial opacities within the perihilar and bibasilar regions. No pleural effusion. No pneumothorax. The visualized skeletal structures are unremarkable. IMPRESSION: Streaky bilateral perihilar and bibasilar interstitial opacities suspicious for atypical/viral pneumonia. Electronically Signed   By: Duanne Guess D.O.   On: 04/05/2020 11:34   CT Angio Chest PE W and/or Wo Contrast  Result Date: 04/05/2020 CLINICAL DATA:  Dyspnea, hemoptysis EXAM: CT ANGIOGRAPHY CHEST WITH CONTRAST TECHNIQUE: Multidetector CT imaging of the chest was performed using the standard protocol during bolus administration of intravenous contrast. Multiplanar CT image reconstructions and MIPs were obtained to evaluate the vascular anatomy. CONTRAST:  OMNIPAQUE IOHEXOL 350 MG/ML SOLN COMPARISON:  06/03/2007 FINDINGS: Cardiovascular: Satisfactory opacification of the pulmonary arteries to the segmental level. No evidence of pulmonary embolism. No significant coronary artery calcification. Normal heart size. No pericardial effusion. Thoracic aorta is unremarkable. Mediastinum/Nodes: There is extensive mediastinal and bilateral  hilar adenopathy, possibly reactive in nature given the associated findings. The esophagus is unremarkable. Thyroid unremarkable. Lungs/Pleura: There is extensive perihilar airspace infiltrate and consolidation, more prevalent within the right upper lobe and lingula most in keeping with changes of acute multifocal bronchopneumonia. No pneumothorax or pleural effusion. The central airways are widely patent. Upper Abdomen: At least mild hepatic steatosis is noted. Musculoskeletal: No acute bone abnormalities. Review of the MIP images confirms the above findings. IMPRESSION: 1. No evidence of acute pulmonary embolism. 2. Extensive perihilar airspace infiltrate and consolidation, more prevalent within the right upper lobe and lingula most in keeping with changes of acute multifocal bronchopneumonia. The findings are not typical of that seen with COVID-19 pneumonia. 3. Extensive mediastinal and bilateral hilar adenopathy, possibly reactive in nature. 4. At least mild hepatic steatosis. Electronically Signed   By: Helyn Numbers MD   On: 04/05/2020 21:13      Subjective: She denies worsening dyspnea. Report hemoptysis, but getting better   Discharge Exam: Vitals:   04/09/20 0306 04/09/20 0850  BP: (!) 154/86 123/70  Pulse: (!) 58 64  Resp:  (!) 22  Temp: 97.6 F (36.4 C) (!) 97.5 F (36.4 C)  SpO2: 93% 97%  General: Pt is alert, awake, not in acute distress Cardiovascular: RRR, S1/S2 +, no rubs, no gallops Respiratory: CTA bilaterally, no wheezing, no rhonchi Abdominal: Soft, NT, ND, bowel sounds + Extremities: no edema, no cyanosis    The results of significant diagnostics from this hospitalization (including imaging, microbiology, ancillary and laboratory) are listed below for reference.     Microbiology: Recent Results (from the past 240 hour(s))  SARS Coronavirus 2 by RT PCR (hospital order, performed in [facility] hospital lab) Nasopharyngeal Nasopharyngeal Swab     Status:  Abnormal   Collection Time: 04/05/20  7:51 PM   Specimen: Nasopharyngeal Swab  Result Value Ref Range Status   SARS Coronavirus 2 POSITIVE (A) NEGATIVE Final    Comment: RESULT CALLED TO, READ BACK BY AND VERIFIED WITH: Nita Sells 04/05/20 AT 2214 BY AR (NOTE) SARS-CoV-2 target nucleic acids are DETECTED  SARS-CoV-2 RNA is generally detectable in upper respiratory specimens  during the acute phase of infection.  Positive results are indicative  of the presence of the identified virus, but do not rule out bacterial infection or co-infection with other pathogens not detected by the test.  Clinical correlation with patient history and  other diagnostic information is necessary to determine patient infection status.  The expected result is negative.  Fact Sheet for Patients:   BoilerBrush.com.cy   Fact Sheet for Healthcare Providers:   https://pope.com/    This test is not yet approved or cleared by the [place] FDA and  has been authorized for detection and/or diagnosis of SARS-CoV-2 by FDA under an Emergency Use Authorization (EUA).  This EUA will remain in effect (meaning this  test can be used) for the duration of  the COVID-19 declaration under Section 564(b)(1) of the Act, 21 U.S.C. section 360-bbb-3(b)(1), unless the authorization is terminated or revoked sooner.  Performed at [facility], [address] [place], [place] [address]      Labs: BNP (last 3 results) Recent Labs    04/05/20 1911  BNP 32.8   Basic Metabolic Panel: Recent Labs  Lab 04/05/20 1158 04/05/20 1911 04/06/20 0557 04/07/20 0614 04/08/20 0509 04/09/20 0650  NA 137  --  137 141 140 140  K 4.2  --  4.4 4.4 4.3 4.3  CL 98  --  99 104 101 100  CO2 28  --  30 28 28 30   GLUCOSE 113*  --  227* 125* 200* 112*  BUN 9  --  12 15 14 15   CREATININE 0.50  --  0.63 0.60 0.51 0.50  CALCIUM 8.7*  --  8.6* 9.2 9.1 9.1  MG  --  2.0 2.1  1.9 1.9 2.0  PHOS  --   --  3.5 3.9 4.0 3.8   Liver Function Tests: Recent Labs  Lab 04/05/20 1158 04/06/20 0557 04/07/20 0614 04/08/20 0509 04/09/20 0650  AST 26 36 26 24 26   ALT 22 25 22 20 23   ALKPHOS 85 91 82 76 78  BILITOT 0.9 0.8 0.6 0.7 0.7  PROT 7.3 8.0 7.7 7.4 7.2  ALBUMIN 3.5 3.7 3.5 3.4* 3.4*   No results for input(s): LIPASE, AMYLASE in the last 168 hours. No results for input(s): AMMONIA in the last 168 hours. CBC: Recent Labs  Lab 04/05/20 1158 04/06/20 0557 04/07/20 0614 04/08/20 0509 04/09/20 0650  WBC 6.0 4.3 6.5 5.9 6.5  NEUTROABS  --  3.5 4.4 4.1 3.6  HGB 14.1 14.6 14.0 13.3 13.6  HCT 47.7* 48.4* 45.9 45.2 46.9*  MCV 81.7 81.5 80.2 83.5 83.0  PLT 160 154 182 190 201   Cardiac Enzymes: No results for input(s): CKTOTAL, CKMB, CKMBINDEX, TROPONINI in the last 168 hours. BNP: Invalid input(s): POCBNP CBG: Recent Labs  Lab 04/07/20 2018 04/08/20 0824 04/08/20 1623 04/08/20 2143 04/09/20 0848  GLUCAP 225* 129* 136* 197* 105*   D-Dimer No results for input(s): DDIMER in the last 72 hours. Hgb A1c No results for input(s): HGBA1C in the last 72 hours. Lipid Profile No results for input(s): CHOL, HDL, LDLCALC, TRIG, CHOLHDL, LDLDIRECT in the last 72 hours. Thyroid function studies No results for input(s): TSH, T4TOTAL, T3FREE, THYROIDAB in the last 72 hours.  Invalid input(s): FREET3 Anemia work up Recent Labs    04/08/20 0509 04/09/20 0650  FERRITIN 22 23   Urinalysis    Component Value Date/Time   COLORURINE YELLOW (A) 04/05/2020 1110   APPEARANCEUR HAZY (A) 04/05/2020 1110   LABSPEC 1.017 04/05/2020 1110   PHURINE 6.0 04/05/2020 1110   GLUCOSEU NEGATIVE 04/05/2020 1110   HGBUR SMALL (A) 04/05/2020 1110   BILIRUBINUR NEGATIVE 04/05/2020 1110   KETONESUR NEGATIVE 04/05/2020 1110   PROTEINUR >=300 (A) 04/05/2020 1110   NITRITE NEGATIVE 04/05/2020 1110   LEUKOCYTESUR NEGATIVE 04/05/2020 1110   Sepsis Labs Invalid input(s):  PROCALCITONIN,  WBC,  LACTICIDVEN Microbiology Recent Results (from the past 240 hour(s))  SARS Coronavirus 2 by RT PCR (hospital order, [facility]) Nasopharyngeal Nasopharyngeal Swab     Status: Abnormal   Collection Time: 04/05/20  7:51 PM   Specimen: Nasopharyngeal Swab  Result Value Ref Range Status   SARS Coronavirus 2 POSITIVE (A) NEGATIVE Final    Comment: RESULT CALLED TO, READ BACK BY AND VERIFIED WITH: Nita SellsSONJIA WEARVER 04/05/20 AT 2214 BY AR (NOTE) SARS-CoV-2 target nucleic acids are DETECTED  SARS-CoV-2 RNA is generally detectable in upper respiratory specimens  during the acute phase of infection.  Positive results are indicative  of the presence of the identified virus, but do not rule out bacterial infection or co-infection with other pathogens not detected by the test.  Clinical correlation with patient history and  other diagnostic information is necessary to determine patient infection status.  The expected result is negative.  Fact Sheet for Patients:   BoilerBrush.com.cyhttps://www.fda.gov/media/136312/download   Fact Sheet for Healthcare Providers:   https://pope.com/https://www.fda.gov/media/136313/download    This test is not yet approved or cleared by the [place] FDA and  has been authorized for detection and/or diagnosis of SARS-CoV-2 by FDA under an Emergency Use Authorization (EUA).  This EUA will remain in effect (meaning this  test can be used) for the duration of  the COVID-19 declaration under Section 564(b)(1) of the Act, 21 U.S.C. section 360-bbb-3(b)(1), unless the authorization is terminated or revoked sooner.  Performed at [facility], [address] [place], [place] [address]      Time coordinating discharge: 40 minutes  SIGNED:   Alba CoryBelkys A Sheamus Hasting, MD  [facility]

## 2020-04-09 NOTE — Progress Notes (Signed)
Pt on 3L o2 via [place] sats 95% at rest. Removed 02 x 5 minutes and pt sat at rest 92-94% Had pt walk to door to bed x 4 and remained standing and conversating  and 02 sat decreased to 90 and would go up to 92 sitting still on room air. Pt off oxygen for extended period of time. She bathed got dressed and continued to free activity in her room well over 20 minutes returned pt in no distress and her 02 was at 90% and would occassionally drop to 89% but recover back to 90-91%

## 2020-04-09 NOTE — TOC Transition Note (Signed)
Transition of Care [facility]) - CM/SW Discharge Note   Patient Details  Name: Olivia Leonard MRN: [id number] Date of Birth: 01-Jan-1966  [facility] CM/SW Contact:  Maud Deed, LCSW Phone Number: 04/09/2020, 2:34 PM   Clinical Narrative:    Pt it medically stable for discharge per MD. [facility] was consulted for Home O2 and transportation for pt to get home. CSW contacted Jermain with [employer] for Home oxygen and they were able to accept referral and deliver oxygen to pt's room. CSW provided pt with Taxi voucher for transportation home.    Final next level of care: Home/Self Care Barriers to Discharge: No Barriers Identified   Patient Goals and CMS Choice        Discharge Placement                Patient to be transferred to facility by: Tax[employer] Name of family member notified: Ebony Cargo Patient and family notified of of transfer: 04/09/20  Discharge Plan and Services                                     Social Determinants of Health (SDOH) Interventions     Readmission Risk Interventions No flowsheet data found.

## 2020-04-09 NOTE — Discharge Instructions (Signed)
You need to Quarantine until 8/26.

## 2020-04-09 NOTE — Progress Notes (Signed)
IV removed before discharge. The discharge instructions were gone over with patient with previous nurse. Patient stated that she understood and did not have any questions. Patient left going home POV with [employer].

## 2020-04-09 NOTE — Progress Notes (Signed)
pts IV therapy has been delayed upon starting meds pt c/o pain at assessed and noticed knot to arm. Stopped infusion, removed IV attempted to right hand return but vein blew. Consult to IV team as IV team started her last site per pt. D/C pending on completing meds and setting up home 02 and transportation

## 2021-05-24 IMAGING — CT CT ANGIO CHEST
2 of 6 series · 18 of 46 positions shown · IV contrast (omnipaque)
Comparison: 06/03/2007

CLINICAL DATA: Dyspnea, hemoptysis

EXAM:
CT ANGIOGRAPHY CHEST WITH CONTRAST
TECHNIQUE: Multidetector CT imaging of the chest was performed using the
standard protocol during bolus administration of intravenous
contrast. Multiplanar CT image reconstructions and MIPs were
obtained to evaluate the vascular anatomy.
CONTRAST:  100mL OMNIPAQUE IOHEXOL 350 MG/ML SOLN

[Series 5: thins · axial · 0.74mm/px · z∈[-293,-24]mm · 15 of 295 slices shown]
[im 13/295  lung]
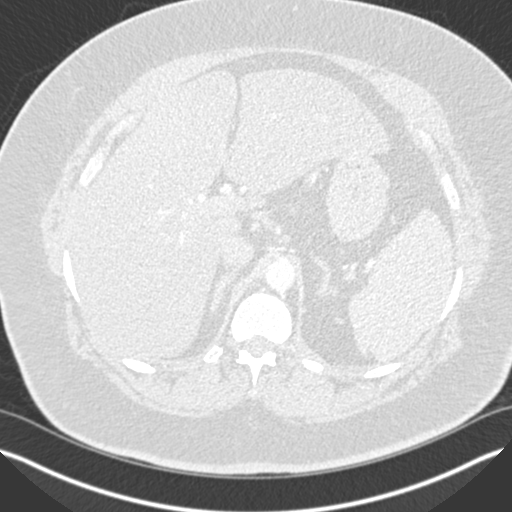
[im 39/295  soft-tissue]
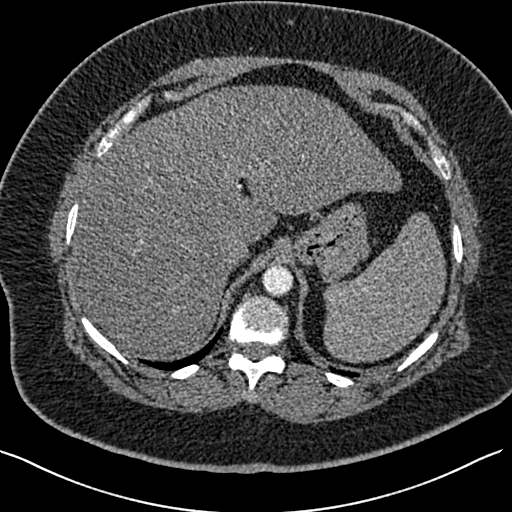
[im 52/295  lung]
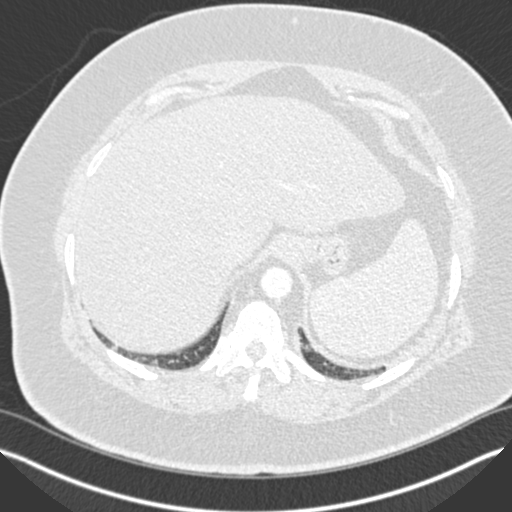
[im 77/295  soft-tissue]
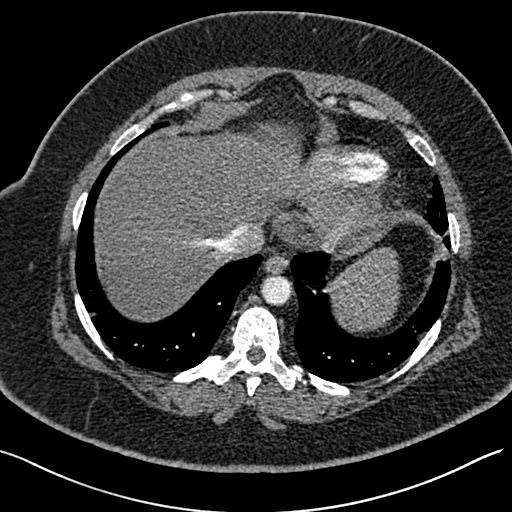
[im 90/295  lung]
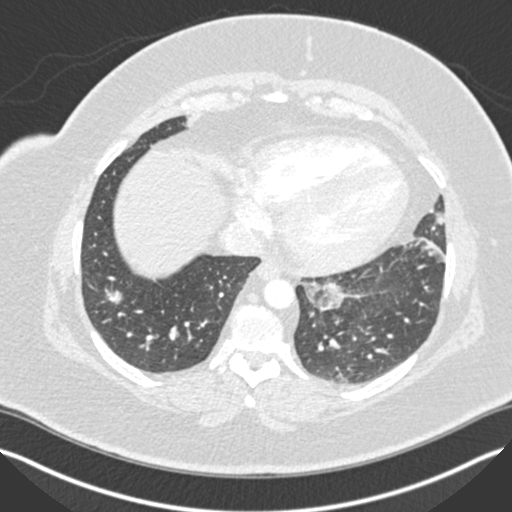
[im 116/295  soft-tissue]
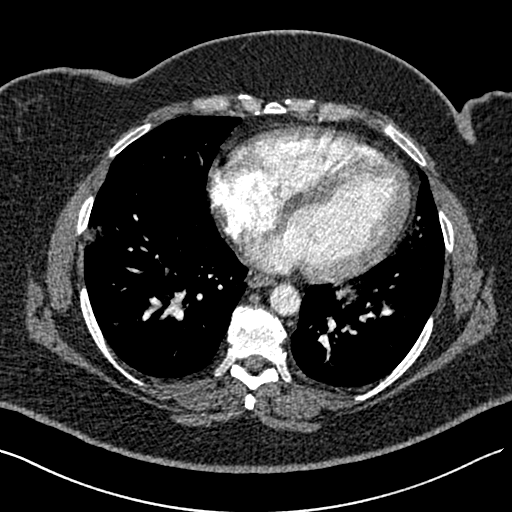
[im 128/295  lung]
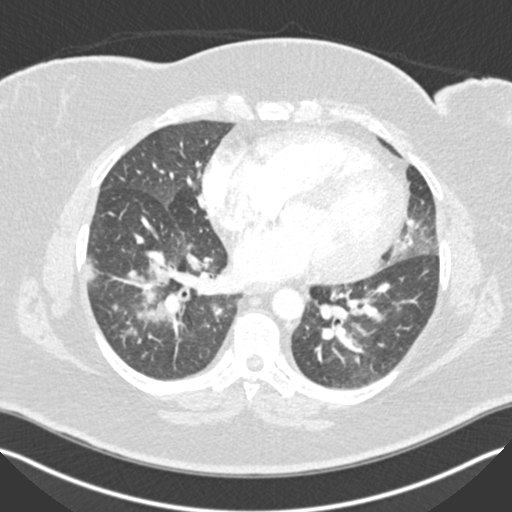
[im 154/295  soft-tissue]
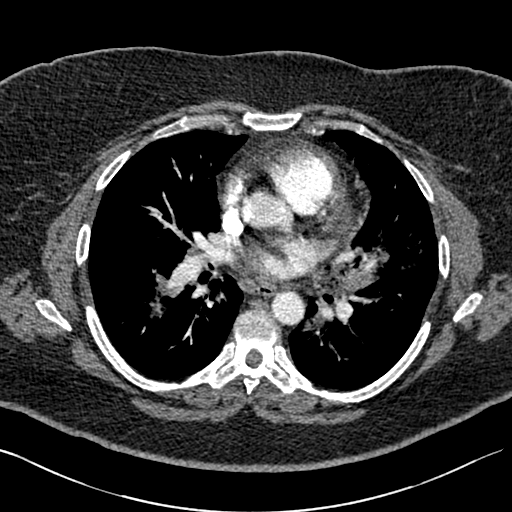
[im 167/295  lung]
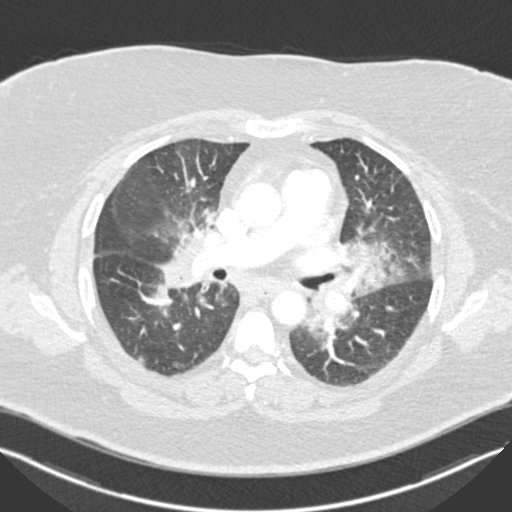
[im 179/295  soft-tissue]
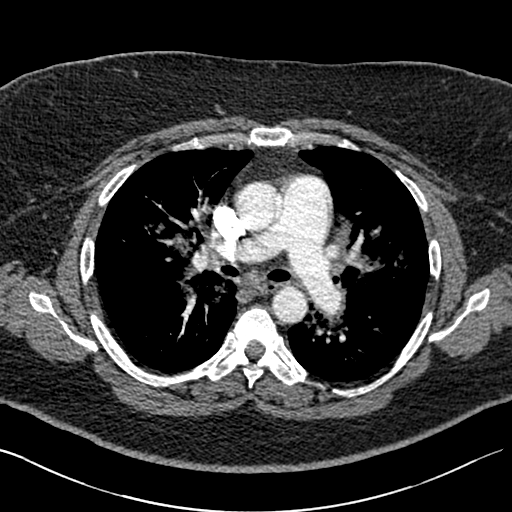
[im 205/295  lung]
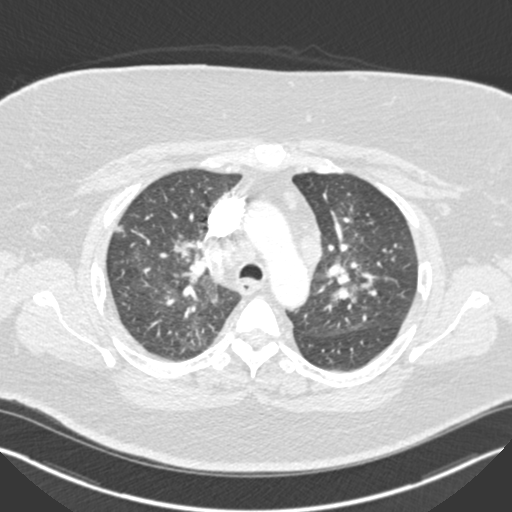
[im 218/295  soft-tissue]
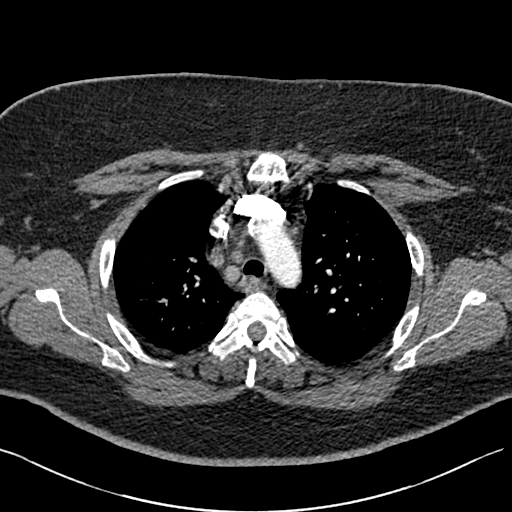
[im 243/295  lung]
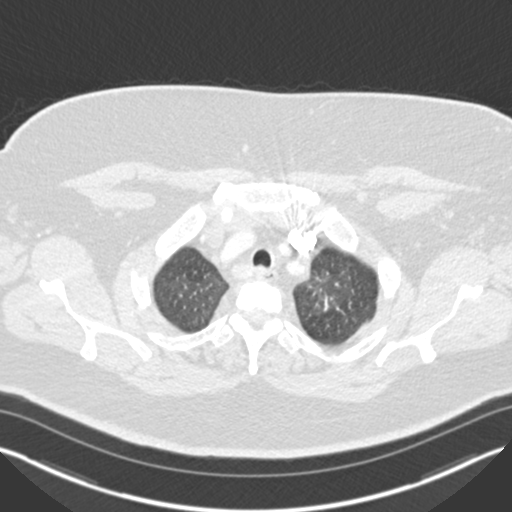
[im 256/295  soft-tissue]
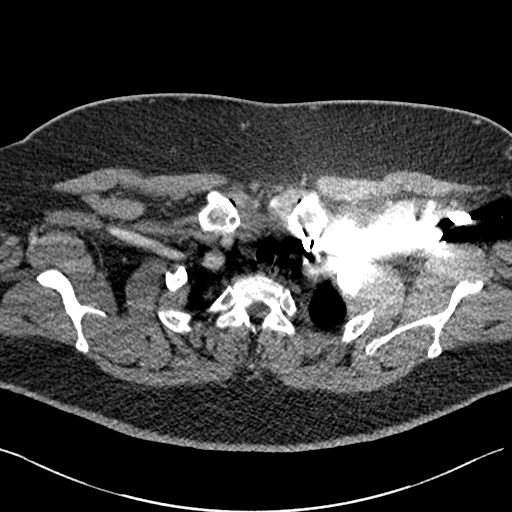
[im 282/295  lung]
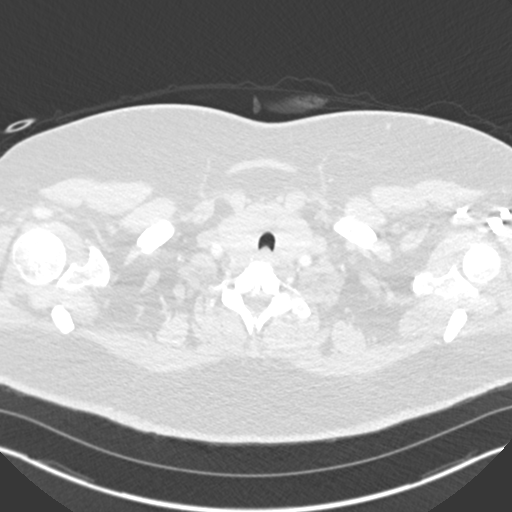

[Series 7: coronal mpr · coronal · 0.59mm/px · 3 of 97 slices shown]
[im 25/97  soft-tissue]
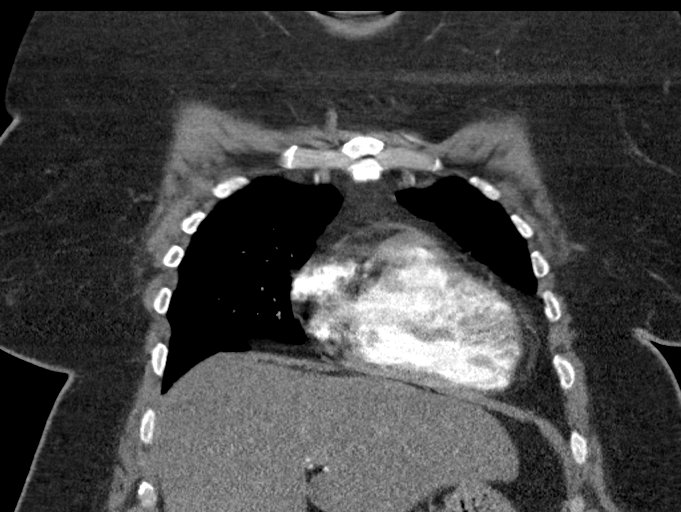
[im 49/97  soft-tissue]
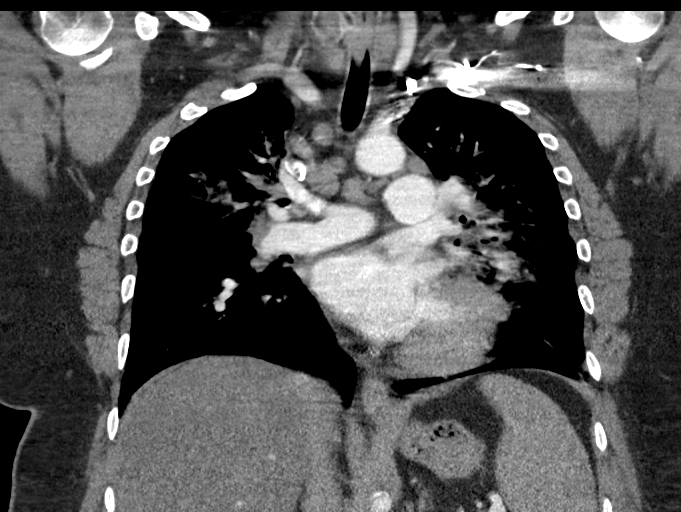
[im 73/97  soft-tissue]
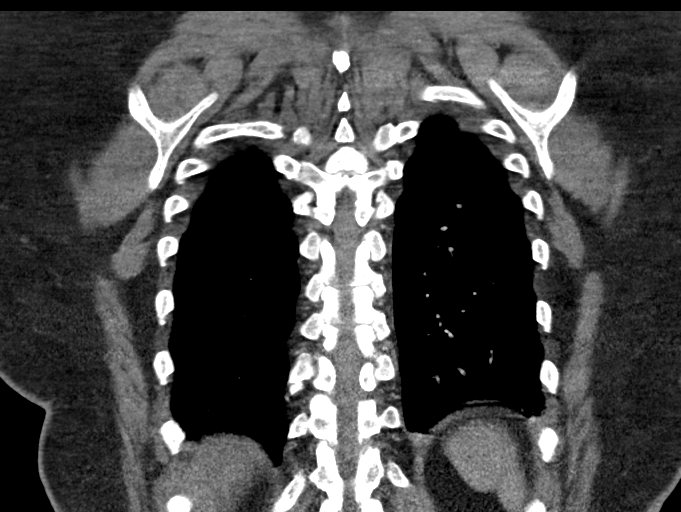

[18 of 46 positions shown; findings below may reference images not displayed]

FINDINGS: Cardiovascular: Satisfactory opacification of the pulmonary arteries
to the segmental level. No evidence of pulmonary embolism. No
significant coronary artery calcification. Normal heart size. No
pericardial effusion. Thoracic aorta is unremarkable.

Mediastinum/Nodes: There is extensive mediastinal and bilateral
hilar adenopathy, possibly reactive in nature given the associated
findings. The esophagus is unremarkable. Thyroid unremarkable.

Lungs/Pleura: There is extensive perihilar airspace infiltrate and
consolidation, more prevalent within the right upper lobe and
lingula most in keeping with changes of acute multifocal
bronchopneumonia. No pneumothorax or pleural effusion. The central
airways are widely patent.

Upper Abdomen: At least mild hepatic steatosis is noted.

Musculoskeletal: No acute bone abnormalities.

Review of the MIP images confirms the above findings.
IMPRESSION: 1. No evidence of acute pulmonary embolism.
2. Extensive perihilar airspace infiltrate and consolidation, more
prevalent within the right upper lobe and lingula most in keeping
with changes of acute multifocal bronchopneumonia. The findings are
not typical of that seen with ROI2M-CJ pneumonia.
3. Extensive mediastinal and bilateral hilar adenopathy, possibly
reactive in nature.
4. At least mild hepatic steatosis.

## 2021-09-07 ENCOUNTER — Ambulatory Visit: Payer: Medicaid Other | Attending: Family Medicine

## 2021-09-07 ENCOUNTER — Other Ambulatory Visit: Payer: Self-pay

## 2021-09-07 DIAGNOSIS — G8929 Other chronic pain: Secondary | ICD-10-CM | POA: Diagnosis present

## 2021-09-07 DIAGNOSIS — M5441 Lumbago with sciatica, right side: Secondary | ICD-10-CM | POA: Insufficient documentation
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
## 2021-09-12 ENCOUNTER — Ambulatory Visit: Payer: Medicaid Other

## 2021-09-12 ENCOUNTER — Other Ambulatory Visit: Payer: Self-pay

## 2021-09-12 DIAGNOSIS — M5441 Lumbago with sciatica, right side: Secondary | ICD-10-CM

## 2021-09-12 DIAGNOSIS — G8929 Other chronic pain: Secondary | ICD-10-CM

## 2021-09-12 NOTE — Patient Instructions (Signed)
Access Code: [email or web address] URL: [email or web address] Date: 09/12/2021 Prepared by: Ria Comment  Exercises Supine Pelvic Tilt - 1 x daily - 7 x weekly - 2 sets - 10 reps - 3s hold Hooklying Lumbar Rotation - 1 x daily - 7 x weekly - 2 sets - 10 reps - 3s hold Hooklying Single Knee to Chest Stretch (Mirrored) - 1 x daily - 7 x weekly - 3 reps - 30s hold
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
## 2021-09-14 ENCOUNTER — Other Ambulatory Visit: Payer: Self-pay

## 2021-09-14 ENCOUNTER — Ambulatory Visit: Payer: Medicaid Other

## 2021-09-14 DIAGNOSIS — M5441 Lumbago with sciatica, right side: Secondary | ICD-10-CM | POA: Diagnosis not present

## 2021-09-14 DIAGNOSIS — G8929 Other chronic pain: Secondary | ICD-10-CM
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
## 2023-02-11 ENCOUNTER — Encounter: Payer: Self-pay | Admitting: Emergency Medicine

## 2023-02-11 ENCOUNTER — Other Ambulatory Visit: Payer: Self-pay

## 2023-02-11 ENCOUNTER — Emergency Department: Payer: Medicaid Other

## 2023-02-11 ENCOUNTER — Emergency Department
Admission: EM | Admit: 2023-02-11 | Discharge: 2023-02-11 | Disposition: A | Discharge status: Home / Self Care | Payer: Medicaid Other | Attending: Emergency Medicine | Admitting: Emergency Medicine

## 2023-02-11 DIAGNOSIS — J40 Bronchitis, not specified as acute or chronic: Secondary | ICD-10-CM | POA: Diagnosis not present

## 2023-02-11 DIAGNOSIS — E039 Hypothyroidism, unspecified: Secondary | ICD-10-CM | POA: Diagnosis not present

## 2023-02-11 DIAGNOSIS — Z8542 Personal history of malignant neoplasm of other parts of uterus: Secondary | ICD-10-CM | POA: Insufficient documentation

## 2023-02-11 DIAGNOSIS — J45909 Unspecified asthma, uncomplicated: Secondary | ICD-10-CM | POA: Diagnosis not present

## 2023-02-11 DIAGNOSIS — R059 Cough, unspecified: Secondary | ICD-10-CM | POA: Diagnosis present

## 2023-02-11 DIAGNOSIS — Z1152 Encounter for screening for COVID-19: Secondary | ICD-10-CM | POA: Insufficient documentation

## 2023-02-11 DIAGNOSIS — H6690 Otitis media, unspecified, unspecified ear: Secondary | ICD-10-CM

## 2023-02-11 DIAGNOSIS — H6691 Otitis media, unspecified, right ear: Secondary | ICD-10-CM | POA: Insufficient documentation

## 2023-02-11 LAB — CBC WITH DIFFERENTIAL/PLATELET
Abs Immature Granulocytes: 0.04 10*3/uL (ref 0.00–0.07)
Basophils Absolute: 0.1 10*3/uL (ref 0.0–0.1)
Basophils Relative: 1 %
Eosinophils Absolute: 0.3 10*3/uL (ref 0.0–0.5)
Eosinophils Relative: 3 %
HCT: 45.9 % (ref 36.0–46.0)
Hemoglobin: 14.7 g/dL (ref 12.0–15.0)
Immature Granulocytes: 0 %
Lymphocytes Relative: 39 %
Lymphs Abs: 4.3 10*3/uL — ABNORMAL HIGH (ref 0.7–4.0)
MCH: 28.8 pg (ref 26.0–34.0)
MCHC: 32 g/dL (ref 30.0–36.0)
MCV: 89.8 fL (ref 80.0–100.0)
Monocytes Absolute: 0.7 10*3/uL (ref 0.1–1.0)
Monocytes Relative: 6 %
Neutro Abs: 5.6 10*3/uL (ref 1.7–7.7)
Neutrophils Relative %: 51 %
Platelets: 206 10*3/uL (ref 150–400)
RBC: 5.11 MIL/uL (ref 3.87–5.11)
RDW: 14.2 % (ref 11.5–15.5)
WBC: 11 10*3/uL — ABNORMAL HIGH (ref 4.0–10.5)
nRBC: 0 % (ref 0.0–0.2)

## 2023-02-11 LAB — BASIC METABOLIC PANEL
Anion gap: 9 (ref 5–15)
BUN: 9 mg/dL (ref 6–20)
CO2: 23 mmol/L (ref 22–32)
Calcium: 8.6 mg/dL — ABNORMAL LOW (ref 8.9–10.3)
Chloride: 106 mmol/L (ref 98–111)
Creatinine, Ser: 0.65 mg/dL (ref 0.44–1.00)
GFR, Estimated: >60 mL/min (ref >60)
Glucose, Bld: 141 mg/dL — ABNORMAL HIGH (ref 70–99)
Potassium: 3.7 mmol/L (ref 3.5–5.1)
Sodium: 138 mmol/L (ref 135–145)

## 2023-02-11 LAB — SARS CORONAVIRUS 2 BY RT PCR: SARS Coronavirus 2 by RT PCR: NEGATIVE

## 2023-02-11 LAB — GROUP A STREP BY PCR: Group A Strep by PCR: NOT DETECTED

## 2023-02-11 MED ORDER — IOHEXOL 300 MG/ML  SOLN
75.0000 mL | Freq: Once | INTRAMUSCULAR | Status: AC | PRN
  Administered 2023-02-11: 75 mL via INTRAVENOUS

## 2023-02-11 MED ORDER — KETOROLAC TROMETHAMINE 30 MG/ML IJ SOLN
15.0000 mg | Freq: Once | INTRAMUSCULAR | Status: AC
  Administered 2023-02-11: 15 mg via INTRAVENOUS
  Filled 2023-02-11: qty 1

## 2023-02-11 MED ORDER — IPRATROPIUM-ALBUTEROL 0.5-2.5 (3) MG/3ML IN SOLN
3.0000 mL | Freq: Once | RESPIRATORY_TRACT | Status: AC
  Administered 2023-02-11: 3 mL via RESPIRATORY_TRACT
  Filled 2023-02-11: qty 3

## 2023-02-11 NOTE — ED Triage Notes (Signed)
Pt c/o R sided ear pain and sore throat to R side. Pt states pain with swallowing. Pt states pain started on Saturday. Pt states put peroxide q-tips in her ear and has been taking OTC tylenol wihtout relief.   Pt states recently had pneumonia, and feels like she "just can't get better". Pt also reports losing a filling in a tooth on her R side and was prescribed abx for a dental infection. Pt with noted cough in triage.

## 2023-02-11 NOTE — ED Provider Notes (Signed)
[facility] Provider Note    Event Date/Time   First MD Initiated Contact with Patient 02/11/23 [phone]     (approximate)   History   Chief Complaint Otalgia, Sore Throat, and Cough   HPI  Olivia Leonard is a 57 y.o. female with past medical history of asthma, hypothyroidism, and endometrial cancer status post hysterectomy who presents to the ED complaining of sore throat.  Patient reports that she has had 3 days of right ear pain moving down the right side of her neck, which has been sore to touch.  This has been associated with sore throat and patient reports significant pain when she goes to swallow.  She has not had any fevers and denies any nausea or vomiting.  She does endorse a cough productive of clear sputum, denies any chest pain or shortness of breath.  She was started on amoxicillin for dental infection by her dentist yesterday, states she has taken 2 doses of this.     Physical Exam   Triage Vital Signs: ED Triage Vitals  Enc Vitals Group     BP 02/11/23 0446 (!) 158/87     Pulse Rate 02/11/23 0446 75     Resp 02/11/23 0446 (!) 22     Temp 02/11/23 0446 97.7 F (36.5 C)     Temp Source 02/11/23 0446 Oral     SpO2 02/11/23 0446 94 %     Weight 02/11/23 0447 248 lb (112.5 kg)     Height 02/11/23 0447 5' 6.5" (1.689 m)     Head Circumference --      Peak Flow --      Pain Score 02/11/23 0446 10     Pain Loc --      Pain Edu? --      Excl. in GC? --     Most recent vital signs: Vitals:   02/11/23 0446  BP: (!) 158/87  Pulse: 75  Resp: (!) 22  Temp: 97.7 F (36.5 C)  SpO2: 94%    Constitutional: Alert and oriented. Eyes: Conjunctivae are normal. Head: Atraumatic. Nose: No congestion/rhinnorhea. Ears: Left TM clear, right TM mildly erythematous without bulging. Mouth/Throat: Mucous membranes are moist.  Posterior oropharynx erythematous without significant edema or exudates. Neck: Right submandibular neck tenderness with mild  edema, no erythema, warmth, or lymphadenopathy noted. Cardiovascular: Normal rate, regular rhythm. Grossly normal heart sounds.  2+ radial pulses bilaterally. Respiratory: Normal respiratory effort.  No retractions. Lungs with expiratory wheezing throughout. Gastrointestinal: Soft and nontender. No distention. Musculoskeletal: No lower extremity tenderness nor edema.  Neurologic:  Normal speech and language. No gross focal neurologic deficits are appreciated.    ED Results / Procedures / Treatments   Labs (all labs ordered are listed, but only abnormal results are displayed) Labs Reviewed  CBC WITH DIFFERENTIAL/PLATELET - Abnormal; Notable for the following components:      Result Value   WBC 11.0 (*)    Lymphs Abs 4.3 (*)    All other components within normal limits  BASIC METABOLIC PANEL - Abnormal; Notable for the following components:   Glucose, Bld 141 (*)    Calcium 8.6 (*)    All other components within normal limits  GROUP A STREP BY PCR  SARS CORONAVIRUS 2 BY RT PCR   RADIOLOGY Chest x-ray reviewed and interpreted by me with no infiltrate, edema, or effusion.  PROCEDURES:  Critical Care performed: No  Procedures   MEDICATIONS ORDERED IN ED: Medications  ketorolac (TORADOL)  30 MG/ML injection 15 mg (15 mg Intravenous Given 02/11/23 0753)  ipratropium-albuterol (DUONEB) 0.5-2.5 (3) MG/3ML nebulizer solution 3 mL (3 mLs Nebulization Given 02/11/23 0753)  iohexol (OMNIPAQUE) 300 MG/ML solution 75 mL (75 mLs Intravenous Contrast Given 02/11/23 0809)     IMPRESSION / MDM / ASSESSMENT AND PLAN / ED COURSE  I reviewed the triage vital signs and the nursing notes.                              57 y.o. female with past medical history of asthma and hypothyroidism who presents to the ED complaining of right ear pain, sore throat, and neck discomfort over the past 3 days.  Patient's presentation is most consistent with acute presentation with potential threat to life or  bodily function.  Differential diagnosis includes, but is not limited to, otitis media, otitis externa, strep pharyngitis, viral pharyngitis, sialoadenitis, dental abscess, other abscess.  Patient nontoxic-appearing and in no acute distress, vital signs remarkable for mild tachypnea but otherwise reassuring, patient currently maintaining oxygen saturations at 94 to 95% on room air.  She does have some wheezing on exam and may have a component of bronchitis, no evidence of pneumonia on chest x-ray.  We will treat with DuoNeb and reassess, but this does not fully explain her ear pain, sore throat, and neck pain.  Examination of her ear and posterior oropharynx is relatively unimpressive, but she does have mild edema and tenderness over her right lateral neck, will further assess with CT imaging.  Labs are reassuring without significant anemia, leukocytosis, lecture abnormality, or AKI.  Strep and COVID testing are negative.  We will treat symptomatically with IV Toradol and reassess following imaging.  CT imaging is reassuring with no evidence of abscess or other infectious process, patient noted to have dental caries that could be contributing to pain.  She was previously started on amoxicillin by her dentist yesterday, which would cover for any otitis media along with dental infection.  Wheezing improved on reassessment and patient currently with no difficulty breathing.  She was counseled to continue this and to follow-up with her PCP, otherwise return to the ED for new or worsening symptoms.  Patient agrees with plan.      FINAL CLINICAL IMPRESSION(S) / ED DIAGNOSES   Final diagnoses:  Bronchitis  Acute otitis media, unspecified otitis media type     Rx / DC Orders   ED Discharge Orders     None        Note:  This document was prepared using Dragon voice recognition software and may include unintentional dictation errors.   Chesley Noon, MD 02/11/23 [phone]

## 2023-02-11 NOTE — ED Notes (Signed)
See triage note Presents with pain to right ear  States pain started last week  Also has had some dental issues

## 2023-11-14 ENCOUNTER — Ambulatory Visit: Admitting: Podiatry

## 2023-11-14 DIAGNOSIS — L600 Ingrowing nail: Secondary | ICD-10-CM

## 2023-11-14 NOTE — Patient Instructions (Signed)

## 2023-11-14 NOTE — Progress Notes (Signed)
 Subjective:  Patient ID: Olivia Leonard, female    DOB: 07-02-1966,  MRN: [id number]  Chief Complaint  Patient presents with   Nail Problem    Pt stated she feels like her big toes are ingrown     58 y.o. female presents with the above complaint.  Patient presents with bilateral hallux medial border ingrown painful to touch is progressive gotten worse worse with ambulation worse with pressure she would like to have removed she has not seen anyone as prior to seeing me denies any other acute complaints pain scale 7 out of 10 dull aching nature   Review of Systems: Negative except as noted in the HPI. Denies N/V/F/Ch.  Past Medical History:  Diagnosis Date   Thyroid disease     Current Outpatient Medications:    albuterol (VENTOLIN HFA) 108 (90 Base) MCG/ACT inhaler, Inhale 2 puffs into the lungs every 6 (six) hours., Disp: 8 g, Rfl: 0   ascorbic acid (VITAMIN C) 500 MG/5ML syrup, Take 5 mLs (500 mg total) by mouth daily., Disp: 473 mL, Rfl: 12   cetirizine (ZYRTEC) 10 MG tablet, Take 10 mg by mouth daily., Disp: , Rfl:    dexamethasone (DECADRON) 6 MG tablet, Take 1 tablet (6 mg total) by mouth daily., Disp: 5 tablet, Rfl: 0   guaiFENesin-dextromethorphan (ROBITUSSIN DM) 100-10 MG/5ML syrup, Take 10 mLs by mouth every 4 (four) hours as needed for cough., Disp: 118 mL, Rfl: 0   levothyroxine (SYNTHROID, LEVOTHROID) 175 MCG tablet, Take 175 mcg by mouth daily before breakfast., Disp: , Rfl:    OMEPRAZOLE PO, Take by mouth., Disp: , Rfl:    zinc sulfate 220 (50 Zn) MG capsule, Take 1 capsule (220 mg total) by mouth daily., Disp: 30 capsule, Rfl: 0  Social History   Tobacco Use  Smoking Status Every Day   Types: Cigarettes  Smokeless Tobacco Never    Allergies  Allergen Reactions   Biaxin [Clarithromycin]    Doxycycline    Codeine Itching   Objective:  There were no vitals filed for this visit. There is no height or weight on file to calculate BMI. Constitutional Well  developed. Well nourished.  Vascular Dorsalis pedis pulses palpable bilaterally. Posterior tibial pulses palpable bilaterally. Capillary refill normal to all digits.  No cyanosis or clubbing noted. Pedal hair growth normal.  Neurologic Normal speech. Oriented to person, place, and time. Epicritic sensation to light touch grossly present bilaterally.  Dermatologic Painful ingrowing nail at medial nail borders of the hallux nail bilaterally. No other open wounds. No skin lesions.  Orthopedic: Normal joint ROM without pain or crepitus bilaterally. No visible deformities. No bony tenderness.   Radiographs: None Assessment:   1. Ingrown toenail of right foot   2. Ingrown left big toenail    Plan:  Patient was evaluated and treated and all questions answered.  Ingrown Nail, bilaterally -Patient elects to proceed with minor surgery to remove ingrown toenail removal today. Consent reviewed and signed by patient. -Ingrown nail excised. See procedure note. -Educated on post-procedure care including soaking. Written instructions provided and reviewed. -Patient to follow up in 2 weeks for nail check.  Procedure: Excision of Ingrown Toenail Location: Bilateral 1st toe medial nail borders. Anesthesia: Lidocaine 1% plain; 1.5 mL and Marcaine 0.5% plain; 1.5 mL, digital block. Skin Prep: Betadine. Dressing: Silvadene; telfa; dry, sterile, compression dressing. Technique: Following skin prep, the toe was exsanguinated and a tourniquet was secured at the base of the toe. The affected nail border was  freed, split with a nail splitter, and excised. Chemical matrixectomy was then performed with phenol and irrigated out with alcohol. The tourniquet was then removed and sterile dressing applied. Disposition: Patient tolerated procedure well. Patient to return in 2 weeks for follow-up.   No follow-ups on file.

## 2024-01-28 NOTE — Progress Notes (Addendum)
 Subjective Patient ID: Olivia Leonard is a 58 y.o. female.  Olivia Leonard is a 58 y.o. female w/ hx of DM II , HTN, hypothyroid who presents to the clinic for evaluation of pleuritic upper back pain that has been worsening over the past week.  Patient reports to having decreased appetite, headache, shortness of breath.  Patient denies having any fevers or chills.  Patient states that she has been having some left-sided neck pain that radiates into her upper arm on the left.  Patient states that she was concerned that she was having a heart attack.  She notes that she did exercise on Monday where she was doing some type of TikTok exercises.  + Tobacco smoker    History provided by:  Patient Language interpreter used: No     Review of Systems  Constitutional:  Positive for appetite change. Negative for chills and fever.  HENT:  Negative for congestion, rhinorrhea and sore throat.   Respiratory:  Positive for cough and shortness of breath. Negative for chest tightness and wheezing.   Cardiovascular:  Negative for chest pain.  Gastrointestinal:  Negative for abdominal pain, diarrhea, nausea and vomiting.  Skin:  Negative for rash.    Patient History  Allergies: Allergies  Allergen Reactions  . Biaxin [Clarithromycin] Dizziness  . Doxycycline    . Codeine Itching     Past Medical History:  Diagnosis Date  . Acute seasonal allergic rhinitis   . Hypertension   . Thyroid disease   . Type II diabetes mellitus    History reviewed. No pertinent surgical history. Social History   Socioeconomic History  . Marital status: Significant Other    Spouse name: Not on file  . Number of children: Not on file  . Years of education: Not on file  . Highest education level: Not on file  Occupational History  . Not on file  Tobacco Use  . Smoking status: Every Day  . Smokeless tobacco: Never  Vaping Use  . Vaping status: Never Used  Substance and Sexual Activity  . Alcohol use: Never   . Drug use: Never  . Sexual activity: Yes    Partners: Male  Other Topics Concern  . Not on file  Social History Narrative  . Not on file   History reviewed. No pertinent family history. Current Outpatient Medications on File Prior to Visit  Medication Sig Dispense Refill  . atorvastatin (Lipitor) 40 MG tablet     . cetirizine (ZyrTEC) 10 MG tablet Take 10 mg by mouth 1 (one) time each day.    . cyclobenzaprine (Flexeril) 5 MG tablet Take 5 mg by mouth 2 (two) times a day if needed.    . hydrOXYzine HCl (Atarax) 25 MG tablet Take 25 mg by mouth every 6 (six) hours if needed.    . levothyroxine  (Synthroid , Levoxyl ) 175 MCG tablet Take 175 mcg by mouth 1 (one) time each day before breakfast.    . lisinopril-hydroCHLOROthiazide 20-25 MG tablet Take 1 tablet by mouth 1 (one) time each day.    . montelukast (Singulair) 10 MG tablet Take 10 mg by mouth.    SABRA omeprazole (PriLOSEC) 20 MG DR capsule Take 20 mg by mouth 1 (one) time each day before breakfast. Do not crush or chew.    . Ozempic, 0.25 or 0.5 MG/DOSE, 2 MG/3ML solution pen-injector INJECT 0.5 MG UNDER THE SKIN EVERY 7 DAYS    . Ozempic, 1 MG/DOSE, 4 MG/3ML solution pen-injector INJECT 1 MG SUBCUTANEOUSLY EVERY 7 DAYS    .  metFORMIN (Glucophage) 1000 MG tablet TAKE 1 TABLET (1,000 MG) BY MOUTH IN THE MORNING (Patient not taking: Reported on 01/28/2024)    . [DISCONTINUED] albuterol  108 (90 Base) MCG/ACT inhaler Inhale 2 puffs every 4 (four) hours if needed for wheezing or shortness of breath. 18 g 0   No current facility-administered medications on file prior to visit.     Objective  Vitals:   01/28/24 1607  BP: 127/82  Pulse: 100  Resp: 18  Temp: (!) 37.8 C (100 F)  SpO2: 95%  Weight: 116 kg  Height: 5' 5  PainSc: 0-No pain        OBGYN/Pregnancy Status: Hysterectomy       No results found.  Physical Exam Vitals and nursing note reviewed.  Constitutional:      General: She is not in acute distress.     Appearance: Normal appearance. She is obese. She is not ill-appearing, toxic-appearing or diaphoretic.  HENT:     Head: Normocephalic and atraumatic.     Right Ear: Tympanic membrane, ear canal and external ear normal.     Left Ear: Tympanic membrane, ear canal and external ear normal.     Nose: Nose normal. No congestion or rhinorrhea.     Mouth/Throat:     Mouth: Mucous membranes are moist.     Pharynx: Oropharynx is clear. No oropharyngeal exudate or posterior oropharyngeal erythema.  Eyes:     General:        Right eye: No discharge.        Left eye: No discharge.     Conjunctiva/sclera: Conjunctivae normal.  Cardiovascular:     Rate and Rhythm: Normal rate and regular rhythm.     Heart sounds: Normal heart sounds.  Pulmonary:     Effort: Pulmonary effort is normal. No respiratory distress.     Breath sounds: Normal breath sounds. No wheezing, rhonchi or rales.  Abdominal:     General: Abdomen is flat.     Palpations: Abdomen is soft.  Musculoskeletal:     Cervical back: Normal range of motion and neck supple. No rigidity.  Skin:    General: Skin is warm and dry.     Coloration: Skin is not jaundiced or pale.     Findings: No rash.  Neurological:     General: No focal deficit present.     Mental Status: She is alert.  Psychiatric:        Mood and Affect: Mood normal.        Behavior: Behavior normal.      Results for orders placed or performed in visit on 01/28/24  XR chest 2 views   Narrative   Examination Description: Chest xray, frontal and lateral views  Comparisons: None provided.    Findings Fullness to the left hilum with probable perihilar infiltrate  No pleural effusion is noted.   There is no pneumothorax present.   The soft tissues and bones are unremarkable for age.   Follow-up exam after treatment to ensure clearing, underlying lesion cannot be excluded. Consider follow-up CT.     Impression   1. Fullness to the left hilum with probable  perihilar infiltrate 2. Follow-up exam after treatment to ensure clearing, underlying lesion cannot be excluded. Consider follow-up CT.  Electronically signed by: Ozell MICAEL Potters, M. D. on 01/28/2024 16:57:46  ECG 12 lead   Narrative   ECG Interpretation:  Rhythm: Sinus tachycardia at 102 beats per minute Axis: Normal axis  Intervals: Normal PR interval QRS  Complex: Normal ST Segment: Normal ST-T segments QT Interval: Normal  Compared with prior: No, None Available.   Summary of Clinical Condition:  Pleuritic mid scapular back pain, left  sided neck pain, left arm pain   Interpretation by Alm Prophet, PA      Procedures MDM:     1 Acute complicated illness or injury     Explanation of Medical Decision Making and variances from expected care:    Reviewed chest x-ray findings with patient. 1. Fullness to the left hilum with probable perihilar infiltrate 2. Follow-up exam after treatment to ensure clearing, underlying lesion cannot be excluded. Consider follow-up CT.  Patient agreeable to follow-up with her primary care at [facility] for further workup with higher level imaging. Based off symptoms we will treat patient for community-acquired pneumonia.  Plan was to treat patient with amoxicillin and doxycycline  or azithromycin but patient states that she cannot take either doxycycline  or azithromycin and electing to just taking the amoxicillin as monotherapy.  Strict return precautions reviewed with patient.  ED precautions discussed with patient.     Assessment requiring historian other than patient: No     Independent visualization of image, tracing, or test: No     Discussion of management with another provider: No     Risk:: Moderate          Assessment/Plan Diagnoses and all orders for this visit:  Community acquired pneumonia of left lung, unspecified part of lung  Pleuritic pain -     XR chest 2 views -     ECG 12 lead  Shortness of breath -     albuterol  108  (90 Base) MCG/ACT inhaler; Inhale 2 puffs every 4 (four) hours if needed for wheezing or shortness of breath.  Abnormal chest x-ray  Other orders -     amoxicillin (Amoxil) 500 MG capsule; Take 2 capsules (1,000 mg total) by mouth every 8 (eight) hours for 7 days.      Disposition Status: Home  Progress note signed by Alm Prophet, PA on 01/28/24 at  7:49 PM  XR chest 2 views  Final Result    1. Fullness to the left hilum with probable perihilar infiltrate  2. Follow-up exam after treatment to ensure clearing, underlying lesion cannot be excluded. Consider follow-up CT.    Electronically signed by: Ozell MICAEL Potters, M. D. on 01/28/2024 16:57:46

## 2024-03-12 NOTE — Telephone Encounter (Signed)
 Patient called with CT scan results:  CT ABD/PELVIS IMPRESSION: 1.  No evidence of recurrent or metastatic disease in the abdomen or pelvis.  CT CHEST IMPRESSION:   *  New 1.8 cm irregular nodular opacity in the subpleural right lower lobe favored to represent atelectasis or infection/inflammation. Neoplasm is considered a less likely etiology. Consider follow-up CT chest in 6 weeks to evaluate stability. *  Diffuse bronchial wall thickening likely due to chronic bronchitis.  Patient does report being treated for pneumonia recently. Will plan follow-up chest CT in 6 weeks per recommendations of radiology. Patient is in agreement with plan. All questions answered.

## 2024-05-11 NOTE — Telephone Encounter (Signed)
 Attempted to gather more information regarding message. Pt didn't answer, vm left for pt to return call.

## 2024-06-10 NOTE — Progress Notes (Signed)
 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 - Patient to return in 6 months for surveillance exam. Repeat CT imaging of abdomen and pelvis in 02/2024 showed no signs of recurrence.  - Lung nodule being followed on chest CT - plan repeat imaging later this month.  - Urinary urgency - followed by urogyn.   History of Present Illness: Olivia Leonard is a 58 y.o. woman who presented to her PCP for evaluation of AUB consisting of heavy and irregular cycles. She had TVUS showing thickened and heterogenous endometrial stripe of 1.2 cm. She then had an endometrial biopsy with FIGO grade 1 endometrial cancer. Thus she was referred to gyn onc.  Patient underwent laparoscopic and robot-assisted lysis of adhesions (>45 minutes), robot-assisted pelvic sentinel lymph node injection and mapping, robot-assisted total laparoscopic hysterectomy and bilateral salpingo-oophorectomy. She presents for surveilance visit today.   She has been doing well since her last visit with me - denies VB, VD, pelvic pain, changes in bowel habits.  She is a substitute engineer, site. She is emotional today - her daughter unexpectedly moved out this summer to live with her boyfriend and his family. She has not seen her since July and is having lunch with her daughter today.   Imaging in 03/2024 showed:  Impression Since 03/10/2024: -New right upper lobe perifissural airspace disease could represent infection. -Decreased size and conspicuity of subpleural right lower lobe irregular nodular opacity favors resolving infection/inflammation. -Decreased size of prominent mediastinal lymph nodes which are likely reactive  Imaging in 02/2024 showed:  CT ABD/PELVIS  IMPRESSION: 1.  No evidence of recurrent or metastatic disease in the abdomen or pelvis.   CT CHEST IMPRESSION:   *  New 1.8 cm irregular nodular opacity in the subpleural right lower lobe favored to represent atelectasis or infection/inflammation. Neoplasm is considered a less likely etiology. Consider follow-up CT chest in 6 weeks to evaluate stability. *  Diffuse bronchial wall thickening likely due to chronic bronchitis.      Imaging in 06/2023 showed:  IMPRESSION: Since CT dated 03/18/2023, - No new or enlarging sites of metastatic disease within the abdomen or pelvis. - Additional chronic and incidental findings within the body of the report.   Prior imaging showed 5/24:  Chest/ABD/Pelvis CT IMPRESSION: --New soft tissue stranding along the superior and posterior aspects of the vaginal cuff extending to the rectum with new rectal wall thickening as well as prominent stranding throughout the mesorectal fat. Findings are indeterminate and could represent infectious or inflammatory etiology given the short time interval, however findings are worrisome for disease recurrence due to the presence of vaginal cuff involvement. --Partially imaged right middle lobe consolidation and a few branching centrilobular groundglass nodules in the right lower lobe, which may represent evolving infection or inflammation. Consider follow-up CT of the chest in 6 to 8 weeks to ensure clearance.  Follow-up imaging in 02/2023 showed:      CT CHEST IMPRESSION:   No new thoracic metastatic disease.     CT ABD/PELVIS IMPRESSION:   - Improvement in stranding surrounding the vaginal cuff. No CT evidence of metastatic disease in the abdomen or pelvis.   - Please see concurrent chest imaging for further description of findings above the diaphragm.  For patient's visit today, I performed a complete review of her medical record, including clinic notes, op notes and pathology reports.   Past Medical  History: Past Medical History:  Diagnosis Date  . Anxiety   . Arthritis 2023  . Asthma (HHS-HCC) 2015  . Diabetes    (CMS-HCC)    non-insulin  dependent, well controlled  . Dry eyes   . GERD (gastroesophageal reflux disease)   . Hyperlipidemia   . Hypertension   . Hypothyroid   . Obesity   . Tobacco use    1ppd  . Uterine cancer    (CMS-HCC)     Past Surgical History: Past Surgical History:  Procedure Laterality Date  . APPENDECTOMY  1999   open mcburneys incision  . CESAREAN SECTION  2006   x2  . HYSTERECTOMY    . OOPHORECTOMY    . PR LAPAROSCOPY W TOT HYSTERECT UTERUS 250 GRAM OR LESS N/A 05/25/2022   Procedure: ROBOTIC XI LAPAROSCOPY, SURGICAL, WITH TOTAL HYSTERECTOMY, FOR UTERUS 250 G OR LESS;  Surgeon: Obadiah Morene Daring, MD;  Location: MAIN OR [facility];  Service: Gynecology Oncology  . SALPINGECTOMY  1991   LSC salpingectomy for tubal ectopic  . TONSILLECTOMY    . TUBAL LIGATION  2006   at time of CS    Family History: Family History  Problem Relation Age of Onset  . Diabetes Mother   . Hypertension Mother   . Colon cancer Maternal Grandfather   . Cancer Maternal Grandfather        colon  . Breast cancer Neg Hx   . Endometrial cancer Neg Hx   . Ovarian cancer Neg Hx   . Heart failure Neg Hx     Physical Exam:  Constitutional: No acute distress. Neuro/Psych: Alert, oriented.  Head and Neck: Normocephalic, atraumatic. Neck symmetric without masses. Sclera anicteric.  Respiratory: Normal work of breathing. Clear to auscultation bilaterally. Cardiovascular: Regular rate and rhythm, no murmurs, rubs, or gallops. Abdomen: Obese. Soft, non-distended, non-tender to palpation. No masses or hepatosplenomegaly appreciated. No evidence of hernia. No palpable fluid wave. Incisions well-healed.  Extremities: Grossly normal range of motion. Warm, well perfused. No edema bilaterally. Skin: No rashes or lesions. Lymphatic: No cervical, supraclavicular, or inguinal  adenopathy. Genitourinary: External genitalia without lesions. Urethral meatus without lesions or prolapse. On speculum exam, vaginal cuff is well-healed. BME is performed - no msses or nodularity, although exam limited due to patient's body habitus.   Preop Labs and Imaging:  03/20/22 TVUS:  --Heterogeneous, mildly thickened endometrium with vascularity, which is nonspecific and could be seen with hyperplasia or malignancy if the patient is postmenopausal. Consider endometrial biopsy for further characterization.    --Small 1.4 cm anterior uterine body intramural fibroid versus focal heterogeneity adjacent to the C-section scar.    --No right ovarian abnormality. Nonvisualization of the left ovary.   Oncology Summary: Hematology/Oncology History  Adenocarcinoma of the endometrium/uterus    (CMS-HCC)  05/02/2022 Initial Diagnosis   Adenocarcinoma of the endometrium/uterus (CMS-HCC)   05/25/2022 Surgery   Procedure on 05/25/22: Laparoscopic and robot-assisted lysis of adhesions (>45 minutes) Robot-assisted pelvic sentinel lymph node injection and mapping Robot-assisted total laparoscopic hysterectomy and bilateral salpingo-oophorectomy  Operative Findings: On exam, no palpable masses, limited by habitus. On laparoscopy, grossly normal upper abdominal survey including normal bowel, omentum, liver surface, and diaphragm. In lower abdomen, dense adhesions of omentum to the anterior abdominal wall in the midline and right lower quadrant, including midline hernia containing omentum. Uterus normal sized with  anterior bladder adhesions. Normal appearing bilateral fallopian tubes and bilateral ovaries. Extensive mesenteric, omental, and retroperitoneal adiposity making for difficult visualization. Bilateral sentinel lymph node mapping to pelvic nodes noted, but dissection/biopsy abandoned due to difficulties visualization in combination with difficulties in tolerating anesthesia and need to expedite the  procedure for safety from anesthesia perspective due to worsening hypercarbia.   Pathology:  A: Uterus, cervix, bilateral tubes and ovaries, hysterectomy, bilateral salpingo-oophorectomy  - Endometrioid adenocarcinoma, FIGO grade 2 with squamous and mucinous differentiation, arising in an endometrial polyp - Positive for superficial inner half myometrial invasion (2 mm in wall thickness of 22 mm, 9%, A5) - Negative for lymphovascular invasion - See synoptic  Other findings: Cervix: Two endocervical polyps with endocervical microglandular hyperplasia and adenoid basal hyperplasia - No dysplasia or malignancy  Endometrium: endometrial intraepithelial neoplasia (EIN)  Myometrium: - 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, leiomyomata (2 nodules, 5 and 6 mm) Bilateral ovaries: Stromal hyperthecosis and cystic endosalpingiosis, surface adhesions (left) Bilateral fallopian tubes: Bilateral fimbriated fallopian tubes with prior tubal ligation by gross examination and paratubal cysts and adhesions (left)  Thick anterior uterine serosal adhesions   06/06/2022 -  Cancer Staged   Staging form: Corpus Uteri - Carcinoma and Carcinosarcoma, AJCC 8th Edition - Clinical stage from 06/06/2022: FIGO Stage IA, calculated as Stage Unknown (cT1a, cNX, cM0) - Signed by Yetta Marcey Dover, MD on 06/06/2022    06/06/2022 Tumor Board   FIGO Stage: 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  - Consider CT C/A/P given incomplete surgical staging and larger tumor size (5.2 cm).     Procedure on 05/25/22: Laparoscopic and robot-assisted lysis of adhesions (>45 minutes) Robot-assisted pelvic sentinel lymph node injection and mapping Robot-assisted total  laparoscopic hysterectomy and bilateral salpingo-oophorectomy  Operative Findings: On exam, no palpable masses, limited by habitus. On laparoscopy, grossly normal upper abdominal survey including normal bowel, omentum, liver surface, and diaphragm. In lower abdomen, dense adhesions of omentum to the anterior abdominal wall in the midline and right lower quadrant, including midline hernia containing omentum. Uterus normal sized with anterior bladder adhesions. Normal appearing bilateral fallopian tubes and bilateral ovaries. Extensive mesenteric, omental, and retroperitoneal adiposity making for difficult visualization. Bilateral sentinel lymph node mapping to pelvic nodes noted, but dissection/biopsy abandoned due to difficulties visualization in combination with difficulties in tolerating anesthesia and need to expedite the procedure for safety from anesthesia perspective due to worsening hypercarbia.   Pathology: Diagnosis  Date Value Ref Range Status  05/25/2022   Final   A: Uterus, cervix, bilateral tubes and ovaries, hysterectomy, bilateral salpingo-oophorectomy  - Endometrioid adenocarcinoma, FIGO grade 2 with squamous and mucinous differentiation, arising in an endometrial polyp - Positive for superficial inner half myometrial invasion (2 mm in wall thickness of 22 mm, 9%, A5) - Negative for lymphovascular invasion - See synoptic  Other findings: Cervix: Two endocervical polyps with endocervical microglandular hyperplasia and adenoid basal hyperplasia - No dysplasia or malignancy  Endometrium: endometrial intraepithelial neoplasia (EIN)  Myometrium: - 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, leiomyomata (2 nodules, 5 and 6 mm) Bilateral ovaries: Stromal hyperthecosis and cystic endosalpingiosis, surface adhesions (left) Bilateral fallopian tubes: Bilateral fimbriated fallopian tubes with prior tubal ligation by gross examination and paratubal cysts and adhesions  (left)  Thick anterior uterine  serosal adhesions    This electronic signature is attestation that the pathologist personally reviewed the submitted material(s) and the final diagnosis reflects that evaluation.   04/25/2022   Final   A: Endometrium, biopsy - Focal endometrioid adenocarcinoma, FIGO grade 1 (architecture grade 1, nuclear grade 2), arising in a background of endometrial intraepithelial neoplasia (EIN)/atypical hyperplasia  B: Cervix, polypectomy - Benign endocervical polyp with microglandular hyperplasia, squamous metaplasia, and reactive epithelial changes (see comment)  This electronic signature is attestation that the pathologist personally reviewed the submitted material(s) and the final diagnosis reflects that evaluation.   04/25/2022   Final   A: Endometrium, biopsy - Focal endometrioid adenocarcinoma, FIGO grade 1 (architecture grade 1, nuclear grade 2), arising in a background of endometrial intraepithelial neoplasia (EIN)/atypical hyperplasia  B: Cervix, polypectomy - Benign endocervical polyp with microglandular hyperplasia, squamous metaplasia, and reactive epithelial changes (see comment)  This electronic signature is attestation that the pathologist personally reviewed the submitted material(s) and the final diagnosis reflects that evaluation.    Synoptic Report  Date Value Ref Range Status  05/25/2022   Final   ENDOMETRIUM ENDOMETRIUM: HYSTERECTOMY - All Specimens 8th Edition - Protocol posted: 08/09/2021  SPECIMEN    Procedure:    Total hysterectomy and bilateral salpingo-oophorectomy     Specimen Integrity:    Intact   TUMOR    Tumor Site:    Endometrium     Tumor Site:    Endometrial polyp     Tumor Size:    Greatest Dimension (Centimeters): 5.2 cm    Histologic Type:    Endometrioid carcinoma, NOS       Histologic Type Comment:    with squamous and mucinous differentiation     Histologic Grade:    FIGO grade 2     Two-Tier Grading System:     Low grade (encompassing FIGO 1 and 2)     Myometrial Invasion:    Present       Depth of Myometrial Invasion:    2 mm      Myometrial Thickness:    22 mm      Percentage of Myometrial Invasion:    9 %    Adenomyosis:    Present, involved by carcinoma     Uterine Serosa Involvement:    Not identified     Lower Uterine Segment Involvement:    Not identified     Cervical Stromal Involvement:    Not identified     Other Tissue / Organ Involvement:    Not identified     Peritoneal / Ascitic Fluid:    Not submitted / unknown     Lymphatic and / or Vascular Invasion:    Not identified   REGIONAL LYMPH NODES    Regional Lymph Node Status:    Not applicable (no regional lymph nodes submitted or found)   pTNM CLASSIFICATION (AJCC 8th Edition)    Reporting of pT, pN, and (when applicable) pM categories is based on information available to the pathologist at the time the report is issued. As per the AJCC (Chapter 1, 8th Ed.) it is the managing physician's responsibility to establish the final pathologic stage based upon all pertinent information, including but potentially not limited to this pathology report.    pT Category:    pT1a     pN Category:    pN not assigned (no nodes submitted or found)   FIGO STAGE    FIGO Stage:    IA   ADDITIONAL FINDINGS  Additional Findings:    Atypical hyperplasia / endometrial intraepithelial neoplasia (EIN)     Diagnosis Comment  Date Value Ref Range Status  05/25/2022   Final   The carcinoma involves deep adenomyosis.  A CD10 stain highlights endometrial stroma around the carcinoma in the adenomyosis (A7) and no outer half myometrial invasion is identified.  Recut levels are examined (A7). P53: wild type  Immunohistochemistry (IHC) Testing for Mismatch Repair (MMR) Proteins (Block A6 )  MLH1 Intact nuclear expression  MSH2 Intact nuclear expression  MSH6 Intact nuclear expression  PMS2 Intact nuclear expression  Background nonneoplastic  tissue/internal control with intact nuclear expression  + IHC Interpretation No loss of nuclear expression of MMR proteins: low probability of microsatellite instability-high (MSI-H)#  # There are exceptions to the above IHC interpretations. These results should not be considered in isolation, and clinical correlation with genetic counseling is recommended to assess the need for germline testing.       04/25/2022   Final   Part B: Focally, the endocervical epithelium shows loss of mucin and mild cytologic atypia, most likely representing reactive changes. Immunohistochemical stain for p53 shows a wild-type pattern and p16 shows patchy reactivity, supporting absence of endocervical dysplasia.   04/25/2022   Final   Part B: Focally, the endocervical epithelium shows loss of mucin and mild cytologic atypia, most likely representing reactive changes. Immunohistochemical stain for p53 shows a wild-type pattern and p16 shows patchy reactivity, supporting absence of endocervical dysplasia.

## 2024-06-11 NOTE — Progress Notes (Signed)
 I have acted as Audiological scientist for this patient; I was present during the entire sensitive exam and/or procedure. I assisted the provider during a pelvic and bi-manual exam. After exam, I assisted patient to sit up

## 2024-07-02 ENCOUNTER — Emergency Department

## 2024-07-02 ENCOUNTER — Other Ambulatory Visit: Payer: Self-pay

## 2024-07-02 ENCOUNTER — Emergency Department
Admission: EM | Admit: 2024-07-02 | Discharge: 2024-07-02 | Disposition: A | Discharge status: Home / Self Care | Attending: Emergency Medicine | Admitting: Emergency Medicine

## 2024-07-02 DIAGNOSIS — R059 Cough, unspecified: Secondary | ICD-10-CM | POA: Diagnosis present

## 2024-07-02 DIAGNOSIS — J45909 Unspecified asthma, uncomplicated: Secondary | ICD-10-CM | POA: Diagnosis not present

## 2024-07-02 DIAGNOSIS — Z8542 Personal history of malignant neoplasm of other parts of uterus: Secondary | ICD-10-CM | POA: Diagnosis not present

## 2024-07-02 DIAGNOSIS — J189 Pneumonia, unspecified organism: Secondary | ICD-10-CM

## 2024-07-02 DIAGNOSIS — M79672 Pain in left foot: Secondary | ICD-10-CM | POA: Insufficient documentation

## 2024-07-02 DIAGNOSIS — E039 Hypothyroidism, unspecified: Secondary | ICD-10-CM | POA: Insufficient documentation

## 2024-07-02 DIAGNOSIS — E119 Type 2 diabetes mellitus without complications: Secondary | ICD-10-CM | POA: Diagnosis not present

## 2024-07-02 DIAGNOSIS — J181 Lobar pneumonia, unspecified organism: Secondary | ICD-10-CM | POA: Diagnosis not present

## 2024-07-02 HISTORY — DX: Type 2 diabetes mellitus without complications: E11.9

## 2024-07-02 LAB — RESP PANEL BY RT-PCR (RSV, FLU A&B, COVID)  RVPGX2
Influenza A by PCR: NEGATIVE
Influenza B by PCR: NEGATIVE
Resp Syncytial Virus by PCR: NEGATIVE
SARS Coronavirus 2 by RT PCR: NEGATIVE

## 2024-07-02 LAB — POC URINE PREG, ED: Preg Test, Ur: NEGATIVE

## 2024-07-02 MED ORDER — KETOROLAC TROMETHAMINE 15 MG/ML IJ SOLN
15.0000 mg | Freq: Once | INTRAMUSCULAR | Status: AC
  Administered 2024-07-02: 15 mg via INTRAMUSCULAR

## 2024-07-02 MED ORDER — ACETAMINOPHEN 500 MG PO TABS
1000.0000 mg | ORAL_TABLET | Freq: Once | ORAL | Status: AC
  Administered 2024-07-02: 1000 mg via ORAL
  Filled 2024-07-02: qty 2

## 2024-07-02 MED ORDER — ACETAMINOPHEN 500 MG PO TABS: 1000.0000 mg | ORAL_TABLET | Freq: Three times a day (TID) | ORAL | 0 refills | Status: AC | PRN

## 2024-07-02 MED ORDER — METHOCARBAMOL 500 MG PO TABS: 500.0000 mg | ORAL_TABLET | Freq: Three times a day (TID) | ORAL | 0 refills | Status: AC | PRN

## 2024-07-02 MED ORDER — LEVOFLOXACIN 750 MG PO TABS: 750.0000 mg | ORAL_TABLET | Freq: Every day | ORAL | 0 refills | Status: AC

## 2024-07-02 MED ORDER — LEVOFLOXACIN 750 MG PO TABS
750.0000 mg | ORAL_TABLET | Freq: Once | ORAL | Status: AC
  Administered 2024-07-02: 750 mg via ORAL
  Filled 2024-07-02: qty 1

## 2024-07-02 MED ORDER — AMOXICILLIN-POT CLAVULANATE 875-125 MG PO TABS
1.0000 | ORAL_TABLET | Freq: Once | ORAL | Status: AC
  Administered 2024-07-02: 1 via ORAL
  Filled 2024-07-02: qty 1

## 2024-07-02 MED ORDER — LIDOCAINE 5 % EX PTCH: 1.0000 | MEDICATED_PATCH | CUTANEOUS | 0 refills | Status: AC

## 2024-07-02 MED ORDER — AMOXICILLIN-POT CLAVULANATE 875-125 MG PO TABS: 1.0000 | ORAL_TABLET | Freq: Two times a day (BID) | ORAL | 0 refills | Status: AC

## 2024-07-02 MED ORDER — LIDOCAINE 5 % EX PTCH
1.0000 | MEDICATED_PATCH | CUTANEOUS | Status: DC
  Administered 2024-07-02: 1 via TRANSDERMAL
  Filled 2024-07-02: qty 1

## 2024-07-02 MED ORDER — KETOROLAC TROMETHAMINE 15 MG/ML IJ SOLN
15.0000 mg | Freq: Once | INTRAMUSCULAR | Status: DC
  Filled 2024-07-02: qty 1

## 2024-07-02 NOTE — Discharge Instructions (Addendum)
 We believe that your symptoms are caused today by pneumonia, an infection in your lung(s).  Fortunately you should start to improve quickly after taking your antibiotics.  Please take the full course of antibiotics as prescribed and drink plenty of fluids.    Follow up with your doctor within 1-2 days.  If you develop any new or worsening symptoms, including but not limited to fever in spite of taking over-the-counter ibuprofen and/or Tylenol , persistent vomiting, worsening shortness of breath, or other symptoms that concern you, please return to the Emergency Department immediately.   I have also sent in medications for your foot pain.  You can apply the lidocaine patch to help with the foot.  Please also take the Tylenol  as prescribed as well as the muscle relaxer.  Please do not drive, work, make any legal body decisions, drink alcohol, climb on ladders or heights while taking this medication.   Pneumonia Pneumonia is an infection of the lungs.  CAUSES Pneumonia may be caused by bacteria or a virus. Usually, these infections are caused by breathing infectious particles into the lungs (respiratory tract). SIGNS AND SYMPTOMS  Cough. Fever. Chest pain. Increased rate of breathing. Wheezing. Mucus production. DIAGNOSIS  If you have the common symptoms of pneumonia, your health care provider will typically confirm the diagnosis with a chest X-ray. The X-ray will show an abnormality in the lung (pulmonary infiltrate) if you have pneumonia. Other tests of your blood, urine, or sputum may be done to find the specific cause of your pneumonia. Your health care provider may also do tests (blood gases or pulse oximetry) to see how well your lungs are working. TREATMENT  Some forms of pneumonia may be spread to other people when you cough or sneeze. You may be asked to wear a mask before and during your exam. Pneumonia that is caused by bacteria is treated with antibiotic medicine. Pneumonia that is  caused by the influenza virus may be treated with an antiviral medicine. Most other viral infections must run their course. These infections will not respond to antibiotics.  HOME CARE INSTRUCTIONS  Cough suppressants may be used if you are losing too much rest. However, coughing protects you by clearing your lungs. You should avoid using cough suppressants if you can. Your health care provider may have prescribed medicine if he or she thinks your pneumonia is caused by bacteria or influenza. Finish your medicine even if you start to feel better. Your health care provider may also prescribe an expectorant. This loosens the mucus to be coughed up. Take medicines only as directed by your health care provider. Do not smoke. Smoking is a common cause of bronchitis and can contribute to pneumonia. If you are a smoker and continue to smoke, your cough may last several weeks after your pneumonia has cleared. A cold steam vaporizer or humidifier in your room or home may help loosen mucus. Coughing is often worse at night. Sleeping in a semi-upright position in a recliner or using a couple pillows under your head will help with this. Get rest as you feel it is needed. Your body will usually let you know when you need to rest. PREVENTION A pneumococcal shot (vaccine) is available to prevent a common bacterial cause of pneumonia. This is usually suggested for: People over 64 years old. Patients on chemotherapy. People with chronic lung problems, such as bronchitis or emphysema. People with immune system problems. If you are over 65 or have a high risk condition, you may receive  the pneumococcal vaccine if you have not received it before. In some countries, a routine influenza vaccine is also recommended. This vaccine can help prevent some cases of pneumonia. You may be offered the influenza vaccine as part of your care. If you smoke, it is time to quit. You may receive instructions on how to stop smoking. Your  health care provider can provide medicines and counseling to help you quit. SEEK MEDICAL CARE IF: You have a fever. SEEK IMMEDIATE MEDICAL CARE IF:  Your illness becomes worse. This is especially true if you are elderly or weakened from any other disease. You cannot control your cough with suppressants and are losing sleep. You begin coughing up blood. You develop pain which is getting worse or is uncontrolled with medicines. Any of the symptoms which initially brought you in for treatment are getting worse rather than better. You develop shortness of breath or chest pain. MAKE SURE YOU:  Understand these instructions. Will watch your condition. Will get help right away if you are not doing well or get worse. Document Released: 08/13/2005 Document Revised: 12/28/2013 Document Reviewed: 11/02/2010 [facility] Patient Information 2015 [place], [place]. This information is not intended to replace advice given to you by your health care provider. Make sure you discuss any questions you have with your health care provider.

## 2024-07-02 NOTE — ED Triage Notes (Signed)
 Patient states bilateral ear pain/fullness; also wants left foot checked out because it hurts when she walks. Had negative left foot xrays last week at [facility].

## 2024-07-02 NOTE — ED Provider Notes (Signed)
 [facility] Provider Note    Event Date/Time   First MD Initiated Contact with Patient 07/02/24 1842     (approximate)   History   Otalgia   HPI  Olivia Leonard is a 58 y.o. female  with a past medical history of diabetes, hypothyroidism, anxiety, asthma, endometrial cancer presents to the emergency department with bilateral ear pain/fullness, nasal congestion, myalgias, cough x 1 week. She has been doing Flonase, Advil Flu & Cough at home. Patient states she also is having left lateral foot pain and it is hurting when she walks.  Reports she had negative left foot x-rays and was placed in a boot last week at [facility] after being diagnosed with peroneal tendinitis. Has been able to ambulate with boot since then. Denies chest pain, SOB, abdominal pain, N/V/D.  Physical Exam   Triage Vital Signs: ED Triage Vitals [07/02/24 1707]  Encounter Vitals Group     BP 138/75     Girls Systolic BP Percentile      Girls Diastolic BP Percentile      Boys Systolic BP Percentile      Boys Diastolic BP Percentile      Pulse Rate 92     Resp 18     Temp 98.3 F (36.8 C)     Temp Source Oral     SpO2 95 %     Weight      Height      Head Circumference      Peak Flow      Pain Score      Pain Loc      Pain Education      Exclude from Growth Chart     Most recent vital signs: Vitals:   07/02/24 1707 07/02/24 1857  BP: 138/75   Pulse: 92   Resp: 18   Temp: 98.3 F (36.8 C)   SpO2: 95% 95%    General: Awake, in no acute distress. Appears stated age. Head: Normocephalic, atraumatic. Eyes: No scleral icterus or conjunctival injection. Ears/Nose/Throat: TMs intact b/l. Nares patent, no nasal discharge. Oropharynx moist, no erythema or exudate. Dentition intact. No tenderness of the sinuses. Neck: Supple, no lymphadenopathy, no nuchal rigidity. CV: Good peripheral perfusion. DP pulses 2+ b/l. Respiratory:Normal respiratory effort.  No respiratory  distress. Rales heard in right lower lobe and wheezing b/l throughout. GI: Soft, non-distended, non-tender.  MSK: Normal ROM and  5/5 strength in b/l ankles and toes. TTP on lateral aspect of left foot. Skin:Warm, dry, intact. No rashes, lesions, or ecchymosis. No cyanosis or pallor.  No erythema or edema.   ED Results / Procedures / Treatments   Labs (all labs ordered are listed, but only abnormal results are displayed) Labs Reviewed  RESP PANEL BY RT-PCR (RSV, FLU A&B, COVID)  [id number]  POC URINE PREG, ED     EKG     RADIOLOGY CXR  FINDINGS: The heart size and mediastinal contours are within normal limits. Mild atelectasis and/or infiltrate is seen within the right lung base. No pleural effusion or pneumothorax is identified. The visualized skeletal structures are unremarkable.   IMPRESSION: Mild right basilar atelectasis and/or infiltrate.     PROCEDURES:  Critical Care performed: No   Procedures   MEDICATIONS ORDERED IN ED: Medications  lidocaine (LIDODERM) 5 % 1 patch (1 patch Transdermal Patch Applied 07/02/24 2037)  amoxicillin-clavulanate (AUGMENTIN) 875-125 MG per tablet 1 tablet (has no administration in time range)  levofloxacin  (LEVAQUIN ) tablet 750 mg (has  no administration in time range)  acetaminophen  (TYLENOL ) tablet 1,000 mg (1,000 mg Oral Given 07/02/24 2036)  ketorolac  (TORADOL ) 15 MG/ML injection 15 mg (15 mg Intramuscular Given 07/02/24 2039)     IMPRESSION / MDM / ASSESSMENT AND PLAN / ED COURSE  I reviewed the triage vital signs and the nursing notes.                              Differential diagnosis includes, but is not limited to, pneumonia, atelectasis, COVID, flu, RSV, peroneal tendinitis  Patient's presentation is most consistent with acute complicated illness / injury requiring diagnostic workup.  Patient here with cough and cold symptoms as well as left foot pain. Afebrile and nontoxic appearing. Patient was already seen in  [facility] and had negative x-rays that she verbally told me about and was diagnosed with peroneal tendinitis.  She has already been placed in a boot.  She has mild tenderness on exam on the lateral aspect of the left foot.  No surrounding erythema, open wound, or edema.  Full range of motion and strength of her ankles and toes.  Do not believe a repeat x-ray is needed at this time given she has been able to walk on it.  Did give her a shot of Toradol , a dose of Tylenol  and apply lidocaine patch to her foot which she states helped some for her pain.  Encouraged her to continue walking on her foot with the boot and gave prescriptions for Tylenol , Robaxin and lidocaine patches to help with her pain.  Precautions discussed regarding taking the Robaxin.  Respiratory panel was negative.  UPT negative.  Chest x-ray with atelectasis versus infiltrate.  I independently viewed the x-ray and radiologist's report.  I agree with the radiologist's report. Will go ahead and treat for pneumonia.  Talked to pharmacist for medication recommendations given the patient is allergic to doxycycline  and clarithromycin with immunocompromised status with diabetes.  They recommended Augmentin and Levaquin  combo.  Patient was given a dose of both here and then sent prescriptions home as well.  Will have her follow-up with her primary care provider in 1 to 2 days.    The patient may return to the emergency department for any new, worsening, or concerning symptoms. Patient was given the opportunity to ask questions; all questions were answered. Emergency department return precautions were discussed with the patient.  Patient is in agreement to the treatment plan.  Patient is stable for discharge.   FINAL CLINICAL IMPRESSION(S) / ED DIAGNOSES   Final diagnoses:  Community acquired pneumonia of right lower lobe of lung  Left foot pain     Rx / DC Orders   ED Discharge Orders          Ordered    levofloxacin  (LEVAQUIN ) 750 MG  tablet  Daily        07/02/24 2204    amoxicillin-clavulanate (AUGMENTIN) 875-125 MG tablet  2 times daily        07/02/24 2204    lidocaine (LIDODERM) 5 %  Every 24 hours        07/02/24 2206    acetaminophen  (TYLENOL ) 500 MG tablet  Every 8 hours PRN        07/02/24 2206    methocarbamol (ROBAXIN) 500 MG tablet  Every 8 hours PRN        07/02/24 2206             Note:  This document was prepared using Dragon voice recognition software and may include unintentional dictation errors.     Sheron Salm, PA-C 07/02/24 2316    Ernest Ronal BRAVO, MD 07/06/24 [phone]
